# Patient Record
Sex: Female | Born: 1965 | Race: Black or African American | Hispanic: Refuse to answer | Marital: Married | Smoking: Never smoker
Health system: Southern US, Community
[De-identification: ages and names within clinical notes are randomized; demographics above are authoritative.]

## PROBLEM LIST (undated history)

## (undated) DIAGNOSIS — D219 Benign neoplasm of connective and other soft tissue, unspecified: Secondary | ICD-10-CM

## (undated) DIAGNOSIS — D649 Anemia, unspecified: Secondary | ICD-10-CM

## (undated) DIAGNOSIS — IMO0002 Reserved for concepts with insufficient information to code with codable children: Secondary | ICD-10-CM

## (undated) DIAGNOSIS — E785 Hyperlipidemia, unspecified: Secondary | ICD-10-CM

## (undated) DIAGNOSIS — O019 Hydatidiform mole, unspecified: Secondary | ICD-10-CM

## (undated) DIAGNOSIS — R609 Edema, unspecified: Secondary | ICD-10-CM

## (undated) DIAGNOSIS — R600 Localized edema: Secondary | ICD-10-CM

## (undated) HISTORY — DX: Anemia, unspecified: D64.9

## (undated) HISTORY — DX: Benign neoplasm of connective and other soft tissue, unspecified: D21.9

## (undated) HISTORY — DX: Hydatidiform mole, unspecified: O01.9

## (undated) HISTORY — DX: Localized edema: R60.0

## (undated) HISTORY — DX: Hyperlipidemia, unspecified: E78.5

## (undated) HISTORY — DX: Edema, unspecified: R60.9

## (undated) HISTORY — DX: Reserved for concepts with insufficient information to code with codable children: IMO0002

---

## 1986-06-27 DIAGNOSIS — IMO0002 Reserved for concepts with insufficient information to code with codable children: Secondary | ICD-10-CM

## 1986-06-27 DIAGNOSIS — R87619 Unspecified abnormal cytological findings in specimens from cervix uteri: Secondary | ICD-10-CM

## 1986-06-27 HISTORY — DX: Reserved for concepts with insufficient information to code with codable children: IMO0002

## 1986-06-27 HISTORY — DX: Unspecified abnormal cytological findings in specimens from cervix uteri: R87.619

## 1986-06-27 HISTORY — PX: CRYOTHERAPY: SHX1416

## 1995-06-28 DIAGNOSIS — O019 Hydatidiform mole, unspecified: Secondary | ICD-10-CM

## 1995-06-28 HISTORY — DX: Hydatidiform mole, unspecified: O01.9

## 1995-07-27 HISTORY — PX: DILATION AND CURETTAGE OF UTERUS: SHX78

## 1999-06-28 HISTORY — PX: BREAST BIOPSY: SHX20

## 2000-04-17 ENCOUNTER — Encounter: Admission: RE | Admit: 2000-04-17 | Discharge: 2000-04-17 | Payer: Self-pay | Admitting: Obstetrics and Gynecology

## 2000-04-17 ENCOUNTER — Encounter: Payer: Self-pay | Admitting: Obstetrics and Gynecology

## 2000-05-08 ENCOUNTER — Ambulatory Visit (HOSPITAL_COMMUNITY): Admission: RE | Admit: 2000-05-08 | Discharge: 2000-05-08 | Payer: Self-pay

## 2000-05-08 ENCOUNTER — Encounter (INDEPENDENT_AMBULATORY_CARE_PROVIDER_SITE_OTHER): Payer: Self-pay

## 2001-06-05 ENCOUNTER — Other Ambulatory Visit: Admission: RE | Admit: 2001-06-05 | Discharge: 2001-06-05 | Payer: Self-pay | Admitting: Obstetrics and Gynecology

## 2002-06-11 ENCOUNTER — Other Ambulatory Visit: Admission: RE | Admit: 2002-06-11 | Discharge: 2002-06-11 | Payer: Self-pay | Admitting: Obstetrics and Gynecology

## 2003-07-15 ENCOUNTER — Other Ambulatory Visit: Admission: RE | Admit: 2003-07-15 | Discharge: 2003-07-15 | Payer: Self-pay | Admitting: Obstetrics and Gynecology

## 2004-07-21 ENCOUNTER — Other Ambulatory Visit: Admission: RE | Admit: 2004-07-21 | Discharge: 2004-07-21 | Payer: Self-pay | Admitting: Obstetrics and Gynecology

## 2005-03-08 ENCOUNTER — Ambulatory Visit (HOSPITAL_COMMUNITY): Admission: RE | Admit: 2005-03-08 | Discharge: 2005-03-08 | Payer: Self-pay | Admitting: Obstetrics and Gynecology

## 2005-11-09 ENCOUNTER — Other Ambulatory Visit: Admission: RE | Admit: 2005-11-09 | Discharge: 2005-11-09 | Payer: Self-pay | Admitting: Obstetrics and Gynecology

## 2007-07-03 ENCOUNTER — Other Ambulatory Visit: Admission: RE | Admit: 2007-07-03 | Discharge: 2007-07-03 | Payer: Self-pay | Admitting: Obstetrics and Gynecology

## 2007-07-12 ENCOUNTER — Ambulatory Visit (HOSPITAL_COMMUNITY): Admission: RE | Admit: 2007-07-12 | Discharge: 2007-07-12 | Payer: Self-pay | Admitting: Obstetrics and Gynecology

## 2008-02-19 HISTORY — PX: ENDOMETRIAL BIOPSY: SHX622

## 2008-05-27 ENCOUNTER — Encounter: Payer: Self-pay | Admitting: Obstetrics and Gynecology

## 2008-05-27 ENCOUNTER — Ambulatory Visit (HOSPITAL_COMMUNITY): Admission: RE | Admit: 2008-05-27 | Discharge: 2008-05-28 | Payer: Self-pay | Admitting: Obstetrics and Gynecology

## 2008-05-27 HISTORY — PX: LAPAROSCOPIC TOTAL HYSTERECTOMY: SUR800

## 2009-03-23 ENCOUNTER — Ambulatory Visit (HOSPITAL_COMMUNITY): Admission: RE | Admit: 2009-03-23 | Discharge: 2009-03-23 | Payer: Self-pay | Admitting: Obstetrics and Gynecology

## 2010-01-05 ENCOUNTER — Ambulatory Visit: Payer: Self-pay | Admitting: Vascular Surgery

## 2010-03-24 ENCOUNTER — Ambulatory Visit (HOSPITAL_COMMUNITY): Admission: RE | Admit: 2010-03-24 | Discharge: 2010-03-24 | Payer: Self-pay | Admitting: Obstetrics and Gynecology

## 2010-11-09 NOTE — Procedures (Signed)
DUPLEX DEEP VENOUS EXAM - LOWER EXTREMITY   INDICATION:  Chronic bilateral lower extremity edema.   HISTORY:  Edema:  Bilateral.  Trauma/Surgery:  No.  Pain:  No.  PE:  No.  Previous DVT:  No.  Anticoagulants:  No.  Other:   DUPLEX EXAM:                CFV   SFV   PopV  PTV    GSV                R  L  R  L  R  L  R   L  R  L  Thrombosis    o  o  o  o  o  o  o   o  o  o  Spontaneous   +  +  +  +  +  +  +   +  +  +  Phasic        +  +  +  +  +  +  +   +  +  +  Augmentation  +  +  +  +  +  +  +   +  +  +  Compressible  +  +  +  +  +  +  +   +  +  +  Competent     +  +  +  +  +  +  +   +  +  +   Legend:  + - yes  o - no  p - partial  D - decreased   IMPRESSION:  Bilateral lower extremities appear to be negative for deep  venous thrombosis and deep venous reflux.    _____________________________  Quita Skye Hart [place], M.D.   NT/MEDQ  D:  01/05/2010  T:  01/05/2010  Job:  [id number]

## 2010-11-09 NOTE — Op Note (Signed)
NAME:  Katie Bridges, Katie Bridges            ACCOUNT NO.:  [id number]   MEDICAL RECORD NO.:  [id number]          PATIENT TYPE:  OIB   LOCATION:  1524                         FACILITY:  [facility]   PHYSICIAN:  Cynthia P. Romine, M.D.DATE OF BIRTH:  1965/10/01   DATE OF PROCEDURE:  05/27/2008  DATE OF DISCHARGE:                               OPERATIVE REPORT   PREOPERATIVE DIAGNOSIS:  Menorrhagia with large uterine fibroids.   POSTOPERATIVE DIAGNOSIS:  Menorrhagia with large uterine fibroids same  path pending.   PROCEDURE:  Robotically assisted total laparoscopic hysterectomy.   SURGEON:  Cynthia P. Romine, MD   ASSISTANT:  Lum Keas, MD   ANESTHESIA:  General endotracheal.   ESTIMATED BLOOD LOSS:  250 mL.   COMPLICATIONS:  None.   PROCEDURE IN DETAIL:  The patient was taken to the operating room and  after the induction of adequate general endotracheal anesthesia she was  placed in dorsal lithotomy position and prepped and draped in the usual  fashion.  The posterior weighted and anterior Sims retractor were  placed.  The cervix was grasped on its anterior lip with a single-tooth  tenaculum.  The cervix was sounded to 8 cm.  Cervix was dilated to a #23  Pratt and the 8 cm RUMI was inserted using a small KOH ring and a  vaginal occluder.  The balloon was inflated and the RUMI manipulator and  the vaginal occluder was inflated with 60 mL of air.  A Foley catheter  was then placed.  The legs were brought down to a very low position.  Attention was next turned to the abdomen.  A midline incision was made  transversely approximately 8 cm above the umbilicus.  It was infiltrated  with 0.25% Marcaine.  The incision was carried down to the fascia using  hemostats.  The fascia was grasped with Kochers and entered with Mayo  scissors.  In-line peritoneum was elevated with hemostats and entered  atraumatically.  A pursestring suture of zero Vicryl was placed around  the fascia and a Hasson  cannula was then placed in the peritoneal space.  Pneumoperitoneum was created with the automatic insufflator and proper  placement of the cannula was noted with the laparoscope.  Upon  insufflating the abdomen the accessory ports were then placed.  There  were three 8 mm ports and a 10/11 mm assistance port.  The assistance  port was far laterally on the patient's right and the other three ports  were far laterally on the patient's left and then approximately 10 cm  medial on each side from the umbilicus.  The trocars were inserted under  direct visualization.  The robot was then brought in and docked on the  side to allow access to the vagina for anticipated uterine morcellation.  The surgeon then proceeded to the console.  The utero-ovarian ligaments  were cauterized and cut as were the tubes on each side and the  mesosalpinx.  The round ligaments were also cauterized.  The posterior  leaf of the broad ligament was taken down with scissors.  Monopolar  scissors were used as well as the  PK gyrus for cautery.  The anterior  leaf of the broad ligament was taken down on each side as well and the  bladder was dissected off the cervix.  The uterine arteries were  skeletonized and identified, cauterized and cut on each side.  The KOH  ring could be seen bulging through the vagina laparoscopically and the  monopolar scissors were used to incise around the KOH ring and the  specimen was passed from the vagina.  The robot was then undocked and  removed away from the field.  The legs were put in the high dorsal  lithotomy position.  A posterior weighted and anterior Sims retractor  were placed.  The RUMI and the KOH ring were removed.  The cervix was  grasped with the tenaculum and the uterus was morcellated then removed.  The vaginal cuff was then closed vaginally with three interrupted  sutures of zero Vicryl.  The surgeon and the assistance then regowned  and gloved and attention was next  turned abdominally again.  The  laparoscope was inserted.  Pneumoperitoneum was obtained.  The pedicles  were inspected and were all free of bleeding.  The vaginal cuff was  inspected and irrigated and likewise was free of bleeding.  The trocar  sleeves were removed under direct visualization.  The pneumoperitoneum  was allowed to escape.  Hasson was removed.  The fascia was closed with  the pursestring suture that had been previously placed.  A single suture  of zero Vicryl was placed in the fascia at the assistance port to close  the fascia and that incision and the midline incision was closed  subcuticularly with 4-0 Vicryl Rapide.  The other three 8 mm trocar  sites were closed with Dermabond.  It should be noted that there was a  small nick in the vaginal mucosa superior laterally on the patient's  right presumably from the knife as the large bulky specimen was being  morcellated.  It was closed with a running suture of 4-0 Vicryl and  hemostasis was achieved.  The patient was then allowed to return to the  supine position.  The urine was clear and copious.  Estimated blood loss  was 250 mL.  Sponge and instrument counts were correct.  The patient was  taken to the recovery room in satisfactory condition.      Cynthia P. Romine, M.D.  Electronically Signed     CPR/MEDQ  D:  05/27/2008  T:  05/28/2008  Job:  [id number]

## 2010-11-09 NOTE — Consult Note (Signed)
NEW PATIENT CONSULTATION   Katie Bridges, Katie Bridges  DOB:  [birth year]                                       01/05/2010  EAVWU#:[id number]   Ms. Norbeck is a 45 year old healthy female who is been having edema in  both legs for the past 15 years, right worse than left.  She has no  history of DVT, thrombophlebitis, lymphedema, stasis ulcers, bleeding or  other complications.  She has been evaluated the past for cardiac and  renal problems and the evaluation has been negative.  She has now been  rereferred to evaluate her lower extremity edema.  She does not wear  elastic compression stockings nor elevate the legs at night.   Chronic medical problems:  She has a history of a molar pregnancy in  1996, required chemotherapy, is doing well now.  Previous surgery  includes a hysterectomy.  She has no history of diabetes, hypertension,  coronary artery disease, COPD or stroke.   FAMILY HISTORY:  Negative for coronary artery disease, diabetes and  stroke.   SOCIAL HISTORY:  She is married.  She is a housewife.  She does not use  tobacco or alcohol.   REVIEW OF SYSTEMS:  Totally negative.  No weight loss, anorexia, chest  pain, dyspnea on exertion, PND, or orthopnea.  No asthma.  No wheezing.  No reflux esophagitis.  Denies any GU or vascular symptoms.  Able to  ambulate at least 1 to 2 miles.  No arthritis.  All systems are  negative.   PHYSICAL EXAMINATION:  Blood pressure 115/67, heart rate 79, temperature  97.8.  GENERAL:  This is a well-developed, well-nourished female in no apparent  distress, alert and oriented x3.  HEENT:  Exam normal.  EOMs intact.  LUNGS:  Clear to auscultation.  No rhonchi or wheezing.  CARDIOVASCULAR:  Regular rhythm.  No murmurs.  Carotid pulses 3+.  No  audible bruits.  ABDOMEN:  Soft, nontender with no masses.  MUSCULOSKELETAL:  Exam is free of major deformities.  NEUROLOGIC:  Normal.  SKIN:  Free of rashes.  Lower extremity exam  reveals 3+ femoral, popliteal and 2+ dorsalis pedis  pulses bilaterally.  The right calf has 1+ edema.  The left calf has 1+  edema.  The dorsum of the right foot is more swollen than the left  having 1 to 2+ edema.  Both feet are well-perfused.  There is no  evidence of any varicosities, hyperpigmentation or ulceration.   Today I ordered a venous duplex exam which I reviewed and interpreted.  There is no evidence of any deep venous obstruction.  No evidence of any  deep venous reflux or saphenous reflux in either lower extremity.   I reassured her regarding these findings and made the following  recommendations:  1. Elevate the legs 3-4 inches at night by elevating the foot of the      bed of the mattress.  2. Apply short-leg elastic compression stockings first thing in the      morning to be worn during the daytime.  We have fitted her for      those stockings today and hopefully that will help her symptoms.      No further recommendations.     Quita Skye Hart [place], M.D.  Electronically Signed   JDL/MEDQ  D:  01/05/2010  T:  01/06/2010  Job:  [id number]   cc:   Dr. Eulis Foster P. Romine, M.D.  Brooke Bonito

## 2010-11-12 NOTE — Op Note (Signed)
[facility]  Patient:    Katie Bridges, Katie Bridges                   MRN: [id number] Proc. Date: 05/08/00 Adm. Date:  [id number] Attending:  Meredith Leeds                           Operative Report  PREOPERATIVE DIAGNOSES:  Left breast mass.  POSTOPERATIVE DIAGNOSES:  Left breast mass.  OPERATION:  Left breast biopsy.  SURGEON:  Zigmund Daniel, M.D.  ANESTHESIA:  Local with sedation.  DESCRIPTION OF PROCEDURE:  After the patient was adequately monitored and sedated and had the left breast prepped and draped, I liberally infused local anesthetic in the area just superior to the areolar.  She was comfortable throughout the procedure.  I made a short circular areolar incision and dissected through the fat up to a palpable mass just medial and superior to the areolar.  I completely excised the mass using cautery and got hemostasis with the cautery.  I felt no other masses.  It felt like fibroadenoma. Sponge, needle and instrument counts were correct.  I closed the skin with intracuticular 4-0 Vicryl and Steri-Strips and a bladder compressive bandage. She tolerated the operation well. DD:  05/08/00 TD:  05/08/00 Job: [id number]

## 2011-03-29 LAB — CBC
HCT: 31.1 % — ABNORMAL LOW (ref 36.0–46.0)
Hemoglobin: 10.2 g/dL — ABNORMAL LOW (ref 12.0–15.0)
MCV: 82.7 fL (ref 78.0–100.0)
RBC: 3.76 MIL/uL — ABNORMAL LOW (ref 3.87–5.11)
WBC: 4 10*3/uL (ref 4.0–10.5)

## 2011-03-29 LAB — HCG, SERUM, QUALITATIVE: Preg, Serum: NEGATIVE

## 2011-04-01 LAB — CBC
Hemoglobin: 8.5 g/dL — ABNORMAL LOW (ref 12.0–15.0)
RBC: 3.07 MIL/uL — ABNORMAL LOW (ref 3.87–5.11)
RDW: 13.8 % (ref 11.5–15.5)
WBC: 8.5 10*3/uL (ref 4.0–10.5)

## 2011-04-01 LAB — TYPE AND SCREEN

## 2011-04-01 LAB — ABO/RH: ABO/RH(D): O POS

## 2011-04-11 ENCOUNTER — Other Ambulatory Visit: Payer: Self-pay | Admitting: Obstetrics and Gynecology

## 2011-04-11 DIAGNOSIS — Z1231 Encounter for screening mammogram for malignant neoplasm of breast: Secondary | ICD-10-CM

## 2011-04-20 ENCOUNTER — Ambulatory Visit (HOSPITAL_COMMUNITY)
Admission: RE | Admit: 2011-04-20 | Discharge: 2011-04-20 | Disposition: A | Discharge status: Home / Self Care | Payer: 59 | Source: Ambulatory Visit | Attending: Obstetrics and Gynecology | Admitting: Obstetrics and Gynecology

## 2011-04-20 DIAGNOSIS — Z1231 Encounter for screening mammogram for malignant neoplasm of breast: Secondary | ICD-10-CM

## 2011-05-04 ENCOUNTER — Ambulatory Visit (HOSPITAL_COMMUNITY): Payer: Self-pay

## 2013-02-14 ENCOUNTER — Encounter: Payer: Self-pay | Admitting: Certified Nurse Midwife

## 2013-02-15 ENCOUNTER — Encounter: Payer: Self-pay | Admitting: Obstetrics and Gynecology

## 2013-02-15 ENCOUNTER — Ambulatory Visit (INDEPENDENT_AMBULATORY_CARE_PROVIDER_SITE_OTHER): Payer: 59 | Admitting: Obstetrics and Gynecology

## 2013-02-15 VITALS — BP 140/67 | HR 84 | Resp 20 | Ht 64.0 in | Wt 143.0 lb

## 2013-02-15 DIAGNOSIS — Z0000 Encounter for general adult medical examination without abnormal findings: Secondary | ICD-10-CM

## 2013-02-15 DIAGNOSIS — Z01419 Encounter for gynecological examination (general) (routine) without abnormal findings: Secondary | ICD-10-CM

## 2013-02-15 LAB — POCT URINALYSIS DIPSTICK
Leukocytes, UA: NEGATIVE
Protein, UA: NEGATIVE
Urobilinogen, UA: NEGATIVE

## 2013-02-15 LAB — HEMOGLOBIN, FINGERSTICK: Hemoglobin, fingerstick: 12.3 g/dL (ref 12.0–16.0)

## 2013-02-15 NOTE — Progress Notes (Signed)
47 y.o.   Married    [place] American   female   G2P0020   here for annual exam.    No LMP recorded. Patient has had a hysterectomy.          Sexually active: yes  The current method of family planning is status post hysterectomy.    Exercising: walking 3 times a week Last mammogram:04/20/11 neg   Last pap smear:07/03/07 neg History of abnormal pap: no Smoking:never Alcohol:no Last colonoscopy:never Last Bone Density:  never Last tetanus shot: less than 10 years Last cholesterol check: 2011 total 231, LDL 157, HDL 52  Hgb:  12.3              Urine: neg   No family history on file.  There are no active problems to display for this patient.   Past Medical History  Diagnosis Date  . 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 Abnormal Pap smear 1988  . Fibroid   . Chronic edema   . Anemia     Past Surgical History  Procedure Laterality Date  . Laparoscopic total hysterectomy  05-27-08    robotic (fibroids)  . Cryotherapy  1988  . Breast biopsy  2001    benign  . Dilation and curettage of uterus  07/27/95    molar pregnancy    Allergies: Macrobid  Current Outpatient Prescriptions  Medication Sig Dispense Refill  . Multiple Vitamins-Iron (MULTIVITAMIN/IRON PO) Take by mouth daily.       No current facility-administered medications for this visit.    ROS: Pertinent items are noted in HPI.  Social Hx: Married, no children,    Exam:    BP 140/67  Pulse 84  Resp 20  Ht 5\' 4"  (1.626 m)  Wt 143 lb (64.864 kg)  BMI 24.53 kg/m2 Ht and wt stable from last year  Wt Readings from Last 3 Encounters:  02/15/13 143 lb (64.864 kg)     Ht Readings from Last 3 Encounters:  02/15/13 5\' 4"  (1.626 m)    General appearance: alert, cooperative and appears stated age Head: Normocephalic, without obvious abnormality, atraumatic Neck: no adenopathy, supple, symmetrical, trachea midline and thyroid not enlarged, symmetric, no  tenderness/mass/nodules Lungs: clear to auscultation bilaterally Breasts: Inspection negative, No nipple retraction or dimpling, No nipple discharge or bleeding, No axillary or supraclavicular adenopathy, Normal to palpation without dominant masses Heart: regular rate and rhythm Abdomen: soft, non-tender; bowel sounds normal; no masses,  no organomegaly Extremities: extremities normal, atraumatic, no cyanosis or edema Skin: Skin color, texture, turgor normal. No rashes or lesions Lymph nodes: Cervical, supraclavicular, and axillary nodes normal. No abnormal inguinal nodes palpated Neurologic: Grossly normal   Pelvic: External genitalia:  no lesions              Urethra:  normal appearing urethra with no masses, tenderness or lesions              Bartholins and Skenes: normal                 Vagina: normal appearing vagina with normal color and discharge, no lesions              Cervix: absent              Pap taken: no        Bimanual Exam:  Uterus:  absent  Adnexa: normal adnexa in size, nontender and no masses                                      Rectovaginal: Confirms                                      Anus:  normal sphincter tone, no lesions  A: normal gyn exam     S/p R-TLH, fibroids 324 g in 2009     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

## 2013-02-15 NOTE — Patient Instructions (Addendum)

## 2014-01-15 ENCOUNTER — Telehealth: Payer: Self-pay | Admitting: Obstetrics and Gynecology

## 2014-01-15 NOTE — Telephone Encounter (Signed)
Spoke with patient. Patient states that she noticed a "bump" on the left side inside the "lip" of her vagina on July 4th. Patient put neosporin on bump and it went away. Patient woke up this morning and has a new bump on the right side. Patient states that the bumps have been painful to the touch and are "the same color as my skin." Patient requesting appointment to be seen. Requesting early morning appointment. Offered 10am appointment tomorrow but patient declines.Appointment scheduled for Friday 7/24 at 10am with Regina Eck CNM. Patient agreeable to date and time.  Routing to provider for final review. Patient agreeable to disposition. Will close encounter

## 2014-01-15 NOTE — Telephone Encounter (Signed)
Patient said she has a pimple like spot on the right side of her vagina said she had one on the left side on July 4th

## 2014-01-17 ENCOUNTER — Encounter: Payer: Self-pay | Admitting: Certified Nurse Midwife

## 2014-01-17 ENCOUNTER — Ambulatory Visit (INDEPENDENT_AMBULATORY_CARE_PROVIDER_SITE_OTHER): Payer: 59 | Admitting: Certified Nurse Midwife

## 2014-01-17 VITALS — BP 100/60 | HR 64 | Resp 16 | Ht 64.0 in | Wt 149.0 lb

## 2014-01-17 DIAGNOSIS — L723 Sebaceous cyst: Secondary | ICD-10-CM

## 2014-01-17 DIAGNOSIS — B373 Candidiasis of vulva and vagina: Secondary | ICD-10-CM

## 2014-01-17 DIAGNOSIS — B3731 Acute candidiasis of vulva and vagina: Secondary | ICD-10-CM

## 2014-01-17 MED ORDER — TERCONAZOLE 0.4 % VA CREA: 1.0000 | TOPICAL_CREAM | Freq: Every day | VAGINAL | Status: DC

## 2014-01-17 NOTE — Patient Instructions (Signed)

## 2014-01-17 NOTE — Progress Notes (Signed)
48 y.o. married african american female g2p0020 here with complaint of vaginal symptoms of itching, burning after sexual activity and increase discharge. Describes discharge as white. Patient also complaining of noted bump on labia 12/28/13 and again 01/16/14 which she applied Neopsorin to and now feel has resolved. No new personal products, some vaginal dryness but uses lubrication. Onset of symptoms 3 days ago, no STD concerns. Urinary symptoms none .   O:Healthy female WDWN Affect: normal, orientation x 3  Exam: Abdomen:soft Lymph node: no enlargement or tenderness Pelvic exam: External genital: tiny sebaceous cyst noted on posterior aspect of right labia, noted on left labia BUS: negative Vagina: white scant discharge, slight tenderness noted. Ph: 4.5  ,Wet prep taken Cervix:absent Uterus: absent Adnexa:normal, non tender, no masses or fullness noted   Wet Prep results:Positive for yeast   A:Sebaceous cyst Yeast vaginitis   P:Discussed findings of sebaceous cyst and etiology.Questions addressed. Discussed yeast vaginitis and need for treatment. Discussed Aveeno or baking soda sitz bath for comfort. Avoid moist clothes or pads for extended period of time. If working out in gym clothes or swim suits for long periods of time change underwear or bottoms of swimsuit if possible. Olive Oil use for skin protection prior to activity can be used to external skin. Rx: Terazol 7 vaginal cream Try coconut oil for vaginal dryness prn  Rv prn

## 2014-01-24 NOTE — Progress Notes (Signed)
Note reviewed, agree with plan.  Jette Lewan, MD  

## 2014-02-20 ENCOUNTER — Ambulatory Visit (INDEPENDENT_AMBULATORY_CARE_PROVIDER_SITE_OTHER): Payer: 59 | Admitting: Obstetrics and Gynecology

## 2014-02-20 ENCOUNTER — Encounter: Payer: Self-pay | Admitting: Obstetrics and Gynecology

## 2014-02-20 VITALS — BP 124/82 | HR 72 | Ht 65.0 in | Wt 151.0 lb

## 2014-02-20 DIAGNOSIS — Z01419 Encounter for gynecological examination (general) (routine) without abnormal findings: Secondary | ICD-10-CM

## 2014-02-20 DIAGNOSIS — Z0000 Encounter for general adult medical examination without abnormal findings: Secondary | ICD-10-CM

## 2014-02-20 DIAGNOSIS — N951 Menopausal and female climacteric states: Secondary | ICD-10-CM

## 2014-02-20 LAB — POCT URINALYSIS DIPSTICK
Bilirubin, UA: NEGATIVE
GLUCOSE UA: NEGATIVE
Ketones, UA: NEGATIVE
Leukocytes, UA: NEGATIVE
Nitrite, UA: NEGATIVE
PH UA: 6
Protein, UA: NEGATIVE
RBC UA: NEGATIVE
UROBILINOGEN UA: NEGATIVE

## 2014-02-20 NOTE — Progress Notes (Signed)
GYNECOLOGY VISIT  PCP: Dr. Daryel Gerald  Referring provider:   HPI: 48 y.o.   Married  [place] American  female   [phone] with Patient's last menstrual period was 05/27/2008.   here for   annual Left labial pain like a throbbing, lasting a second.  Uncertain if related to her new underwear.  Had a sebaceous cyst and yeast vaginitis recently.   Some hot flashes but they are manageable.  Feels like she is sweating more.  No vaginal dryness.   Hgb:  PCP Urine:  Neg ph: 6.0  GYNECOLOGIC HISTORY: Patient's last menstrual period was 05/27/2008. Sexually active:  yes Partner preference: female Contraception:   hysterectomy Menopausal hormone therapy: no DES exposure:   no Blood transfusions: yes, 1997 from the Gestational trophoblastic disease   Sexually transmitted diseases:no    GYN procedures and prior surgeries: Hysterectomy, D&C, robotic TLH for fibroids in 2009  Last mammogram:   07/2013 per pt thur pcp.  Due for a recheck in October 2015.  Would like to return to a facility in [place].              Last pap and high risk HPV testing: 07/03/07 neg   History of abnormal pap smear:  no   OB History   Grav Para Term Preterm Abortions TAB SAB Ect Mult Living   2 0 0  2  2   0       LIFESTYLE: Exercise:  Walking 2 times a week              OTHER HEALTH MAINTENANCE: Tetanus/TDap:2002.  Patient will check with PCP.  HPV: no Influenza:  02/15   Bone density:never Colonoscopy:never  Cholesterol check: PCP  History reviewed. No pertinent family history.  There are no active problems to display for this patient.  Past Medical History  Diagnosis Date  . 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 Abnormal Pap smear 1988  . Fibroid   . Chronic edema   . Anemia     Past Surgical History  Procedure Laterality Date  . Laparoscopic total hysterectomy  05-27-08    robotic (fibroids)  . Cryotherapy  1988  . Breast biopsy  2001    benign   . Dilation and curettage of uterus  07/27/95    molar pregnancy  . Endometrial biopsy  02/19/08    normal    ALLERGIES: Latex and Macrobid  Current Outpatient Prescriptions  Medication Sig Dispense Refill  . Multiple Vitamins-Iron (MULTIVITAMIN/IRON PO) Take by mouth daily.      Marland Kitchen terconazole (TERAZOL 7) 0.4 % vaginal cream Place 1 applicator vaginally at bedtime.  45 g  0   No current facility-administered medications for this visit.     ROS:  Pertinent items are noted in HPI.  History   Social History  . Marital Status: Married    Spouse Name: N/A    Number of Children: N/A  . Years of Education: N/A   Occupational History  . Not on file.   Social History Main Topics  . Smoking status: Never Smoker   . Smokeless tobacco: Never Used  . Alcohol Use: No  . Drug Use: No  . Sexual Activity: Yes    Partners: Male    Birth Control/ Protection: Surgical     Comment: R-TLH 2009   Other Topics Concern  . Not on file   Social History Narrative  . No narrative on file  PHYSICAL EXAMINATION:    BP 124/82  Pulse 72  Ht 5\' 5"  (1.651 m)  Wt 151 lb (68.493 kg)  BMI 25.13 kg/m2  LMP 05/27/2008   Wt Readings from Last 3 Encounters:  02/20/14 151 lb (68.493 kg)  01/17/14 149 lb (67.586 kg)  02/15/13 143 lb (64.864 kg)     Ht Readings from Last 3 Encounters:  02/20/14 5\' 5"  (1.651 m)  01/17/14 5\' 4"  (1.626 m)  02/15/13 5\' 4"  (1.626 m)    General appearance: alert, cooperative and appears stated age Head: Normocephalic, without obvious abnormality, atraumatic Neck: no adenopathy, supple, symmetrical, trachea midline and thyroid not enlarged, symmetric, no tenderness/mass/nodules Lungs: clear to auscultation bilaterally Breasts: Inspection negative, No nipple retraction or dimpling, No nipple discharge or bleeding, No axillary or supraclavicular adenopathy, Normal to palpation without dominant masses Heart: regular rate and rhythm Abdomen: soft, non-tender; no  masses,  no organomegaly Extremities: extremities normal, atraumatic, no cyanosis or edema Skin: Skin color, texture, turgor normal. No rashes or lesions Lymph nodes: Cervical, supraclavicular, and axillary nodes normal. No abnormal inguinal nodes palpated Neurologic: Grossly normal  Pelvic: External genitalia:  no lesions              Urethra:  normal appearing urethra with no masses, tenderness or lesions              Bartholins and Skenes: normal                 Vagina: normal appearing vagina with normal color and discharge, no lesions              Cervix:  absent              Pap and high risk HPV testing done: No.        Bimanual Exam:  Uterus:   absent                                      Adnexa: normal adnexa in size, nontender and no masses                                      Rectovaginal:  Yes.                                        Confirms above.                                      Anus:  normal sphincter tone, no lesions  ASSESSMENT  Normal gynecologic exam. Status post robotic TLH. Menopausal symptoms.  PLAN  Mammogram recommended yearly starting at age 30. Pap smear and high risk HPV testing as above. Counseled on self breast exam, Calcium and vitamin D intake, exercise. See lab orders: No. Discussed treatment of menopausal symptoms - herbal options, estrogen replacement, vaginal lubricants - estrogen and nonestrogen, SSRIs. Will do herbal options now.  Brochure to patient about menopause.  Return annually or prn   An After Visit Summary was printed and given to the patient.

## 2014-02-20 NOTE — Patient Instructions (Signed)
EXERCISE AND DIET:  We recommended that you start or continue a regular exercise program for good health. Regular exercise means any activity that makes your heart beat faster and makes you sweat.  We recommend exercising at least 30 minutes per day at least 3 days a week, preferably 4 or 5.  We also recommend a diet low in fat and sugar.  Inactivity, poor dietary choices and obesity can cause diabetes, heart attack, stroke, and kidney damage, among others.    ALCOHOL AND SMOKING:  Women should limit their alcohol intake to no more than 7 drinks/beers/glasses of wine (combined, not each!) per week. Moderation of alcohol intake to this level decreases your risk of breast cancer and liver damage. And of course, no recreational drugs are part of a healthy lifestyle.  And absolutely no smoking or even second hand smoke. Most people know smoking can cause heart and lung diseases, but did you know it also contributes to weakening of your bones? Aging of your skin?  Yellowing of your teeth and nails?  CALCIUM AND VITAMIN D:  Adequate intake of calcium and Vitamin D are recommended.  The recommendations for exact amounts of these supplements seem to change often, but generally speaking 600 mg of calcium (either carbonate or citrate) and 800 units of Vitamin D per day seems prudent. Certain women may benefit from higher intake of Vitamin D.  If you are among these women, your doctor will have told you during your visit.    PAP SMEARS:  Pap smears, to check for cervical cancer or precancers,  have traditionally been done yearly, although recent scientific advances have shown that most women can have pap smears less often.  However, every woman still should have a physical exam from her gynecologist every year. It will include a breast check, inspection of the vulva and vagina to check for abnormal growths or skin changes, a visual exam of the cervix, and then an exam to evaluate the size and shape of the uterus and  ovaries.  And after 49 years of age, a rectal exam is indicated to check for rectal cancers. We will also provide age appropriate advice regarding health maintenance, like when you should have certain vaccines, screening for sexually transmitted diseases, bone density testing, colonoscopy, mammograms, etc.   MAMMOGRAMS:  All women over 40 years old should have a yearly mammogram. Many facilities now offer a "3D" mammogram, which may cost around $50 extra out of pocket. If possible,  we recommend you accept the option to have the 3D mammogram performed.  It both reduces the number of women who will be called back for extra views which then turn out to be normal, and it is better than the routine mammogram at detecting truly abnormal areas.    COLONOSCOPY:  Colonoscopy to screen for colon cancer is recommended for all women at age 50.  We know, you hate the idea of the prep.  We agree, BUT, having colon cancer and not knowing it is worse!!  Colon cancer so often starts as a polyp that can be seen and removed at colonscopy, which can quite literally save your life!  And if your first colonoscopy is normal and you have no family history of colon cancer, most women don't have to have it again for 10 years.  Once every ten years, you can do something that may end up saving your life, right?  We will be happy to help you get it scheduled when you are ready.    Be sure to check your insurance coverage so you understand how much it will cost.  It may be covered as a preventative service at no cost, but you should check your particular policy.     Menopause and Herbal Products Menopause is the normal time of life when menstrual periods stop completely. Menopause is complete when you have missed 12 consecutive menstrual periods. It usually occurs between the ages of 33 to 51, with an average age of 9. Very rarely does a woman develop menopause before 49 years old. At menopause, your ovaries stop producing the female  hormones, estrogen and progesterone. This can cause undesirable symptoms and also affect your health. Sometimes the symptoms can occur 4 to 5 years before the menopause begins. There is no relationship between menopause and:  Oral contraceptives.  Number of children you had.  Race.  The age your menstrual periods started (menarche). Heavy smokers and very thin women may develop menopause earlier in life. Estrogen and progesterone hormone treatment is the usual method of treating menopausal symptoms. However, there are women who should not take hormone treatment. This is true of:   Women that have breast or uterine cancer.  Women who prefer not to take hormones because of certain side effects (abnormal uterine bleeding).  Women who are afraid that hormones may cause breast cancer.  Women who have a history of liver disease, heart disease, stroke, or blood clots. For these women, there are other medications that may help treat their menopausal symptoms. These medications are found in plants and botanical products. They can be found in the form of herbs, teas, oils, tinctures, and pills.  CAUSES:  The ovaries stop producing the female hormones estrogen and progesterone.  Other causes include:  Surgery to remove both ovaries.  The ovaries stop functioning for no know reason.  Tumors of the pituitary gland in the brain.  Medical disease that affects the ovaries and hormone production.  Radiation treatment to the abdomen or pelvis.  Chemotherapy that affects the ovaries. PHYTOESTROGENS: Phytoestrogens occur naturally in plants and plant products. They act like estrogen in the body. Herbal medications are made from these plants and botanical steroids. There are 3 types of phytoestrogens:  Isoflavones (genistein and daidzein) are found in soy, garbanzo beans, miso and tofu foods.  Ligins are found in the shell of seeds. They are used to make oils like flaxseed oil. The bacteria in  your intestine act on these foods to produce the estrogen-like hormones.  Coumestans are estrogen-like. Some of the foods they are found in include sunflower seeds and bean sprouts. CONDITIONS AND THEIR POSSIBLE HERBAL TREATMENT:  Hot flashes and night sweats.  Soy, black cohosh and evening primrose.  Irritability, insomnia, depression and memory problems.  Chasteberry, ginseng, and soy.  St. John's wort may be helpful for depression. However, there is a concern of it causing cataracts of the eye and may have bad effects on other medications. St. John's wort should not be taken for long time and without your caregiver's advice.  Loss of libido and vaginal and skin dryness.  Wild yam and soy.  Prevention of coronary heart disease and osteoporosis.  Soy and Isoflavones. Several studies have shown that some women benefit from herbal medications, but most of the studies have not consistently shown that these supplements are much better than placebo. Other forms of treatment to help women with menopausal symptoms include a balanced diet, rest, exercise, vitamin and calcium (with vitamin D) supplements, acupuncture, and group therapy  when necessary. THOSE WHO SHOULD NOT TAKE HERBAL MEDICATIONS INCLUDE:  Women who are planning on getting pregnant unless told by your caregiver.  Women who are breastfeeding unless told by your caregiver.  Women who are taking other prescription medications unless told by your caregiver.  Infants, children, and elderly women unless told by your caregiver. Different herbal medications have different and unmeasured amounts of the herbal ingredients. There are no regulations, quality control, and standardization of the ingredients in herbal medications. Therefore, the amount of the ingredient in the medication may vary from one herb, pill, tea, oil or tincture to another. Many herbal medications can cause serious problems and can even have poisonous effects if  taken too much or too long. If problems develop, the medication should be stopped and recorded by your caregiver. HOME CARE INSTRUCTIONS  Do not take or give children herbal medications without your caregiver's advice.  Let your caregiver know all the medications you are taking. This includes prescription, over-the-counter, eye drops, and creams.  Do not take herbal medications longer or more than recommended.  Tell your caregiver about any side effects from the medication. SEEK MEDICAL CARE IF:  You develop a fever of 102 F (38.9 C), or as directed by your caregiver.  You feel sick to your stomach (nauseous), vomit, or have diarrhea.  You develop a rash.  You develop abdominal pain.  You develop severe headaches.  You start to have vision problems.  You feel dizzy or faint.  You start to feel numbness in any part of your body.  You start shaking (have convulsions). Document Released: 11/30/2007 Document Revised: 05/30/2012 Document Reviewed: 06/29/2010 [employer] Patient Information 2015 [employer]. This information is not intended to replace advice given to you by your health care provider. Make sure you discuss any questions you have with your health care provider.   

## 2014-02-28 ENCOUNTER — Telehealth: Payer: Self-pay | Admitting: Obstetrics and Gynecology

## 2014-02-28 NOTE — Telephone Encounter (Signed)
Pt says she received a bill for [id number]. But i do not see a balance could you take a look and call pt back

## 2014-03-07 ENCOUNTER — Telehealth: Payer: Self-pay | Admitting: Emergency Medicine

## 2014-03-07 DIAGNOSIS — N6310 Unspecified lump in the right breast, unspecified quadrant: Secondary | ICD-10-CM

## 2014-03-07 NOTE — Telephone Encounter (Signed)
Calling patient to discuss 6 month follow up R Breast Mammogram with patient. Prior patient of [facility], imaging done 10/24/13 and needed 6 month follow up R breast Dx mammogram. Per Dr. Quincy Simmonds, patient may have wanted to change care to [facility] for imaging.   Will discuss at return call. If would like to have [facility] provider, to order new imaging at [facility] per Dr. Quincy Simmonds.  Will await return call from patient to discuss.

## 2014-03-07 NOTE — Telephone Encounter (Signed)
Spoke with patient. She would like to have imaging scheduled in [place]. Advised can schedule at [facility]. Patient agreeable.  R Breast diagnostic imaging scheduled for 04/28/14 at 1100. Patient advised she will need to request her images be sent from [place]. She will need to request records directly so [facility] can [facility].  Patient agreeable.  Recall entered for 03/2014.  Routing to provider for final review. Patient agreeable to disposition. Will close encounter

## 2014-04-28 ENCOUNTER — Ambulatory Visit
Admission: RE | Admit: 2014-04-28 | Discharge: 2014-04-28 | Disposition: A | Discharge status: Home / Self Care | Payer: 59 | Source: Ambulatory Visit | Attending: Obstetrics and Gynecology | Admitting: Obstetrics and Gynecology

## 2014-04-28 ENCOUNTER — Encounter: Payer: Self-pay | Admitting: Obstetrics and Gynecology

## 2014-04-28 DIAGNOSIS — N6310 Unspecified lump in the right breast, unspecified quadrant: Secondary | ICD-10-CM

## 2014-06-27 DIAGNOSIS — E785 Hyperlipidemia, unspecified: Secondary | ICD-10-CM

## 2014-06-27 HISTORY — DX: Hyperlipidemia, unspecified: E78.5

## 2014-10-07 ENCOUNTER — Other Ambulatory Visit: Payer: Self-pay | Admitting: Obstetrics and Gynecology

## 2014-10-07 DIAGNOSIS — N6489 Other specified disorders of breast: Secondary | ICD-10-CM

## 2014-10-28 ENCOUNTER — Ambulatory Visit
Admission: RE | Admit: 2014-10-28 | Discharge: 2014-10-28 | Disposition: A | Discharge status: Home / Self Care | Payer: 59 | Source: Ambulatory Visit | Attending: Obstetrics and Gynecology | Admitting: Obstetrics and Gynecology

## 2014-10-28 DIAGNOSIS — N6489 Other specified disorders of breast: Secondary | ICD-10-CM

## 2015-03-11 ENCOUNTER — Encounter: Payer: Self-pay | Admitting: Obstetrics and Gynecology

## 2015-03-11 ENCOUNTER — Ambulatory Visit (INDEPENDENT_AMBULATORY_CARE_PROVIDER_SITE_OTHER): Payer: 59 | Admitting: Obstetrics and Gynecology

## 2015-03-11 VITALS — BP 100/68 | HR 76 | Resp 20 | Ht 64.5 in | Wt 149.6 lb

## 2015-03-11 DIAGNOSIS — N760 Acute vaginitis: Secondary | ICD-10-CM

## 2015-03-11 DIAGNOSIS — N951 Menopausal and female climacteric states: Secondary | ICD-10-CM

## 2015-03-11 DIAGNOSIS — Z01419 Encounter for gynecological examination (general) (routine) without abnormal findings: Secondary | ICD-10-CM

## 2015-03-11 DIAGNOSIS — Z0000 Encounter for general adult medical examination without abnormal findings: Secondary | ICD-10-CM | POA: Diagnosis not present

## 2015-03-11 LAB — POCT URINALYSIS DIPSTICK
BILIRUBIN UA: NEGATIVE
Blood, UA: NEGATIVE
Glucose, UA: NEGATIVE
KETONES UA: NEGATIVE
LEUKOCYTES UA: NEGATIVE
NITRITE UA: NEGATIVE
PH UA: 5
Protein, UA: NEGATIVE
Urobilinogen, UA: NEGATIVE

## 2015-03-11 MED ORDER — ESTRADIOL 1 MG PO TABS: 1.0000 mg | ORAL_TABLET | Freq: Every day | ORAL | Status: DC

## 2015-03-11 MED ORDER — ESTRADIOL 10 MCG VA TABS: ORAL_TABLET | VAGINAL | Status: DC

## 2015-03-11 NOTE — Patient Instructions (Addendum)
EXERCISE AND DIET:  We recommended that you start or continue a regular exercise program for good health. Regular exercise means any activity that makes your heart beat faster and makes you sweat.  We recommend exercising at least 30 minutes per day at least 3 days a week, preferably 4 or 5.  We also recommend a diet low in fat and sugar.  Inactivity, poor dietary choices and obesity can cause diabetes, heart attack, stroke, and kidney damage, among others.    ALCOHOL AND SMOKING:  Women should limit their alcohol intake to no more than 7 drinks/beers/glasses of wine (combined, not each!) per week. Moderation of alcohol intake to this level decreases your risk of breast cancer and liver damage. And of course, no recreational drugs are part of a healthy lifestyle.  And absolutely no smoking or even second hand smoke. Most people know smoking can cause heart and lung diseases, but did you know it also contributes to weakening of your bones? Aging of your skin?  Yellowing of your teeth and nails?  CALCIUM AND VITAMIN D:  Adequate intake of calcium and Vitamin D are recommended.  The recommendations for exact amounts of these supplements seem to change often, but generally speaking 600 mg of calcium (either carbonate or citrate) and 800 units of Vitamin D per day seems prudent. Certain women may benefit from higher intake of Vitamin D.  If you are among these women, your doctor will have told you during your visit.    PAP SMEARS:  Pap smears, to check for cervical cancer or precancers,  have traditionally been done yearly, although recent scientific advances have shown that most women can have pap smears less often.  However, every woman still should have a physical exam from her gynecologist every year. It will include a breast check, inspection of the vulva and vagina to check for abnormal growths or skin changes, a visual exam of the cervix, and then an exam to evaluate the size and shape of the uterus and  ovaries.  And after 50 years of age, a rectal exam is indicated to check for rectal cancers. We will also provide age appropriate advice regarding health maintenance, like when you should have certain vaccines, screening for sexually transmitted diseases, bone density testing, colonoscopy, mammograms, etc.   MAMMOGRAMS:  All women over 40 years old should have a yearly mammogram. Many facilities now offer a "3D" mammogram, which may cost around $50 extra out of pocket. If possible,  we recommend you accept the option to have the 3D mammogram performed.  It both reduces the number of women who will be called back for extra views which then turn out to be normal, and it is better than the routine mammogram at detecting truly abnormal areas.    COLONOSCOPY:  Colonoscopy to screen for colon cancer is recommended for all women at age 50.  We know, you hate the idea of the prep.  We agree, BUT, having colon cancer and not knowing it is worse!!  Colon cancer so often starts as a polyp that can be seen and removed at colonscopy, which can quite literally save your life!  And if your first colonoscopy is normal and you have no family history of colon cancer, most women don't have to have it again for 10 years.  Once every ten years, you can do something that may end up saving your life, right?  We will be happy to help you get it scheduled when you are ready.    Be sure to check your insurance coverage so you understand how much it will cost.  It may be covered as a preventative service at no cost, but you should check your particular policy.     Estradiol tablets What is this medicine? ESTRADIOL (es tra DYE ole) is an estrogen. It is mostly used as hormone replacement in menopausal women. It helps to treat hot flashes and prevent osteoporosis. It is also used to treat women with low estrogen levels or those who have had their ovaries removed. This medicine may be used for other purposes; ask your health care  provider or pharmacist if you have questions. COMMON BRAND NAME(S): Estrace, Femtrace, Gynodiol What should I tell my health care provider before I take this medicine? They need to know if you have or ever had any of these conditions: -abnormal vaginal bleeding -blood vessel disease or blood clots -breast, cervical, endometrial, ovarian, liver, or uterine cancer -dementia -diabetes -gallbladder disease -heart disease or recent heart attack -high blood pressure -high cholesterol -high level of calcium in the blood -hysterectomy -kidney disease -liver disease -migraine headaches -protein C deficiency -protein S deficiency -stroke -systemic lupus erythematosus (SLE) -tobacco smoker -an unusual or allergic reaction to estrogens, other hormones, medicines, foods, dyes, or preservatives -pregnant or trying to get pregnant -breast-feeding How should I use this medicine? Take this medicine by mouth. To reduce nausea, this medicine may be taken with food. Follow the directions on the prescription label. Take this medicine at the same time each day and in the order directed on the package. Do not take your medicine more often than directed. Contact your pediatrician regarding the use of this medicine in children. Special care may be needed. A patient package insert for the product will be given with each prescription and refill. Read this sheet carefully each time. The sheet may change frequently. Overdosage: If you think you have taken too much of this medicine contact a poison control center or emergency room at once. NOTE: This medicine is only for you. Do not share this medicine with others. What if I miss a dose? If you miss a dose, take it as soon as you can. If it is almost time for your next dose, take only that dose. Do not take double or extra doses. What may interact with this medicine? Do not take this medicine with any of the following medications: -aromatase inhibitors like  aminoglutethimide, anastrozole, exemestane, letrozole, testolactone This medicine may also interact with the following medications: -carbamazepine -certain antibiotics used to treat infections -certain barbiturates or benzodiazepines used for inducing sleep or treating seizures -grapefruit juice -medicines for fungus infections like itraconazole and ketoconazole -raloxifene or tamoxifen -rifabutin, rifampin, or rifapentine -ritonavir -St. John's Wort -warfarin This list may not describe all possible interactions. Give your health care provider a list of all the medicines, herbs, non-prescription drugs, or dietary supplements you use. Also tell them if you smoke, drink alcohol, or use illegal drugs. Some items may interact with your medicine. What should I watch for while using this medicine? Visit your doctor or health care professional for regular checks on your progress. You will need a regular breast and pelvic exam and Pap smear while on this medicine. You should also discuss the need for regular mammograms with your health care professional, and follow his or her guidelines for these tests. This medicine can make your body retain fluid, making your fingers, hands, or ankles swell. Your blood pressure can go up. Contact your doctor or  health care professional if you feel you are retaining fluid. If you have any reason to think you are pregnant, stop taking this medicine right away and contact your doctor or health care professional. Smoking increases the risk of getting a blood clot or having a stroke while you are taking this medicine, especially if you are more than 50 years old. You are strongly advised not to smoke. If you wear contact lenses and notice visual changes, or if the lenses begin to feel uncomfortable, consult your eye doctor or health care professional. This medicine can increase the risk of developing a condition (endometrial hyperplasia) that may lead to cancer of the lining  of the uterus. Taking progestins, another hormone drug, with this medicine lowers the risk of developing this condition. Therefore, if your uterus has not been removed (by a hysterectomy), your doctor may prescribe a progestin for you to take together with your estrogen. You should know, however, that taking estrogens with progestins may have additional health risks. You should discuss the use of estrogens and progestins with your health care professional to determine the benefits and risks for you. If you are going to have surgery, you may need to stop taking this medicine. Consult your health care professional for advice before you schedule the surgery. What side effects may I notice from receiving this medicine? Side effects that you should report to your doctor or health care professional as soon as possible: -allergic reactions like skin rash, itching or hives, swelling of the face, lips, or tongue -breast tissue changes or discharge -changes in vision -chest pain -confusion, trouble speaking or understanding -dark urine -general ill feeling or flu-like symptoms -light-colored stools -nausea, vomiting -pain, swelling, warmth in the leg -right upper belly pain -severe headaches -shortness of breath -sudden numbness or weakness of the face, arm or leg -trouble walking, dizziness, loss of balance or coordination -unusual vaginal bleeding -yellowing of the eyes or skin Side effects that usually do not require medical attention (report to your doctor or health care professional if they continue or are bothersome): -hair loss -increased hunger or thirst -increased urination -symptoms of vaginal infection like itching, irritation or unusual discharge -unusually weak or tired This list may not describe all possible side effects. Call your doctor for medical advice about side effects. You may report side effects to FDA at 1-800-FDA-1088. Where should I keep my medicine? Keep out of the reach  of children. Store at room temperature between 20 and 25 degrees C (68 and 77 degrees F). Keep container tightly closed. Protect from light. Throw away any unused medicine after the expiration date. NOTE: This sheet is a summary. It may not cover all possible information. If you have questions about this medicine, talk to your doctor, pharmacist, or health care provider.  2015, Elsevier/Gold Standard. (2010-09-15 74:94:49)  Estradiol vaginal tablets What is this medicine? ESTRADIOL (es tra DYE ole) vaginal tablet is used to help relieve symptoms of vaginal irritation and dryness that occurs in some women during menopause. This medicine may be used for other purposes; ask your health care provider or pharmacist if you have questions. COMMON BRAND NAME(S): Vagifem What should I tell my health care provider before I take this medicine? They need to know if you have any of these conditions: -abnormal vaginal bleeding -blood vessel disease or blood clots -breast, cervical, endometrial, ovarian, liver, or uterine cancer -dementia -diabetes -gallbladder disease -heart disease or recent heart attack -high blood pressure -high cholesterol -high level of calcium  in the blood -hysterectomy -kidney disease -liver disease -migraine headaches -protein C deficiency -protein S deficiency -stroke -systemic lupus erythematosus (SLE) -tobacco smoker -an unusual or allergic reaction to estrogens, other hormones, medicines, foods, dyes, or preservatives -pregnant or trying to get pregnant -breast-feeding How should I use this medicine? This medicine is only for use in the vagina. Do not take by mouth. Wash your hands before and after use. Read package directions carefully. Unwrap the pre-filled applicator package. Lie on your back, part and bend your knees. Gently insert the applicator tip high in the vagina and push the plunger to release the tablet into the vagina. Gently remove the applicator.  Throw away the applicator after use. Do not use your medicine more often than directed. Finish the full course prescribed by your doctor or health care professional even if you think your condition is better. Do not stop using except on the advice of your doctor or health care professional. Talk to your pediatrician regarding the use of this medicine in children. A patient package insert for the product will be given with each prescription and refill. Read this sheet carefully each time. The sheet may change frequently. Overdosage: If you think you have taken too much of this medicine contact a poison control center or emergency room at once. NOTE: This medicine is only for you. Do not share this medicine with others. What if I miss a dose? If you miss a dose, take it as soon as you can. If it is almost time for your next dose, take only that dose. Do not take double or extra doses. What may interact with this medicine? Do not take this medicine with any of the following medications: -aromatase inhibitors like aminoglutethimide, anastrozole, exemestane, letrozole, testolactone This medicine may also interact with the following medications: -antibiotics used to treat tuberculosis like rifabutin, rifampin and rifapentene -raloxifene or tamoxifen -warfarin This list may not describe all possible interactions. Give your health care provider a list of all the medicines, herbs, non-prescription drugs, or dietary supplements you use. Also tell them if you smoke, drink alcohol, or use illegal drugs. Some items may interact with your medicine. What should I watch for while using this medicine? Visit your health care professional for regular checks on your progress. You will need a regular breast and pelvic exam. You should also discuss the need for regular mammograms with your health care professional, and follow his or her guidelines. This medicine can make your body retain fluid, making your fingers,  hands, or ankles swell. Your blood pressure can go up. Contact your doctor or health care professional if you feel you are retaining fluid. If you have any reason to think you are pregnant; stop taking this medicine at once and contact your doctor or health care professional. Tobacco smoking increases the risk of getting a blood clot or having a stroke, especially if you are more than 50 years old. You are strongly advised not to smoke. If you wear contact lenses and notice visual changes, or if the lenses begin to feel uncomfortable, consult your eye care specialist. If you are going to have elective surgery, you may need to stop taking this medicine beforehand. Consult your health care professional for advice prior to scheduling the surgery. What side effects may I notice from receiving this medicine? Side effects that you should report to your doctor or health care professional as soon as possible: -allergic reactions like skin rash, itching or hives, swelling of the face, lips,  or tongue -breast tissue changes or discharge -changes in vision -chest pain -confusion, trouble speaking or understanding -dark urine -general ill feeling or flu-like symptoms -light-colored stools -nausea, vomiting -pain, swelling, warmth in the leg -right upper belly pain -severe headaches -shortness of breath -sudden numbness or weakness of the face, arm or leg -trouble walking, dizziness, loss of balance or coordination -unusual vaginal bleeding -yellowing of the eyes or skin Side effects that usually do not require medical attention (report to your doctor or health care professional if they continue or are bothersome): -hair loss -increased hunger or thirst -increased urination -symptoms of vaginal infection like itching, irritation or unusual discharge -unusually weak or tired This list may not describe all possible side effects. Call your doctor for medical advice about side effects. You may report  side effects to FDA at 1-800-FDA-1088. Where should I keep my medicine? Keep out of the reach of children. Store at room temperature between 15 and 30 degrees C (59 and 86 degrees F). Throw away any unused medicine after the expiration date. NOTE: This sheet is a summary. It may not cover all possible information. If you have questions about this medicine, talk to your doctor, pharmacist, or health care provider.  2015, Elsevier/Gold Standard. (2010-09-15 09:08:58)

## 2015-03-11 NOTE — Progress Notes (Signed)
Patient ID: Katie Bridges, female   DOB: 1966/06/09, 49 y.o.   MRN: [id number] 49 y.o. [id number] Married [place] American female here for annual exam.    Burning with intercourse.  [place] jelly not helping.  Soaps are now irritating on her bottom. Denies vaginal discharge. Obion 78.12 with PCP. Some hot flashes.  Sweating and feels like she has odor.   Some left labial throbbing 3 times in the last year.  No internal vaginal throbbing.   T cholesterol 243 and LDL 154.   HDL 65.  PCP:   Merry Lofty, MD  Patient's last menstrual period was 05/27/2008.          Sexually active: Yes.   female partner The current method of family planning is status post hysterectomy due to fibroids.   Ovaries remain. Exercising: Yes.    walking. Smoker:  no  Health Maintenance: Pap:  07-03-07 Neg History of abnormal Pap:  no MMG:  10-28-14 Density Cat.C/Bil.Diag. No evidence of malignancy;recommend screening 40yr[facility] Colonoscopy:  never BMD:   n/a  Result  n/a TDaP:  PCP Screening Labs:  Hb today: PCP, Urine today: Neg   reports that she has never smoked. She has never used smokeless tobacco. She reports that she does not drink alcohol or use illicit drugs.  Past Medical History  Diagnosis Date  . 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 Abnormal Pap smear 1988    unsure if had any treatment  . Fibroid   . Chronic edema   . Anemia     Past Surgical History  Procedure Laterality Date  . Laparoscopic total hysterectomy  05-27-08    robotic (fibroids)  . Cryotherapy  1988  . Breast biopsy  2001    benign  . Dilation and curettage of uterus  07/27/95    molar pregnancy  . Endometrial biopsy  02/19/08    normal    Current Outpatient Prescriptions  Medication Sig Dispense Refill  . Multiple Vitamins-Iron (MULTIVITAMIN/IRON PO) Take by mouth daily.     No current facility-administered medications for this visit.    History reviewed. No  pertinent family history.  ROS:  Pertinent items are noted in HPI.  Otherwise, a comprehensive ROS was negative.  Exam:   BP 100/68 mmHg  Pulse 76  Resp 20  Ht 5' 4.5" (1.638 m)  Wt 149 lb 9.6 oz (67.858 kg)  BMI 25.29 kg/m2  LMP 05/27/2008    General appearance: alert, cooperative and appears stated age Head: Normocephalic, without obvious abnormality, atraumatic Neck: no adenopathy, supple, symmetrical, trachea midline and thyroid normal to inspection and palpation Lungs: clear to auscultation bilaterally Breasts: normal appearance, no masses or tenderness, Inspection negative, No nipple retraction or dimpling, No nipple discharge or bleeding, No axillary or supraclavicular adenopathy Heart: regular rate and rhythm Abdomen: soft, non-tender; bowel sounds normal; no masses,  no organomegaly Extremities: extremities normal, atraumatic, no cyanosis or edema Skin: Skin color, texture, turgor normal. No rashes or lesions Lymph nodes: Cervical, supraclavicular, and axillary nodes normal. No abnormal inguinal nodes palpated Neurologic: Grossly normal  Pelvic: External genitalia:  no lesions              Urethra:  normal appearing urethra with no masses, tenderness or lesions              Bartholins and Skenes: normal                 Vagina:  normal appearing vagina with normal color and discharge, no lesions              Cervix: absent              Pap taken: No. Bimanual Exam:  Uterus:  uterus absent              Adnexa: normal adnexa and no mass, fullness, tenderness              Rectovaginal: Yes.  .  Confirms.              Anus:  normal sphincter tone, no lesions  Chaperone was present for exam.  Assessment:   Well woman visit with normal exam. Status post robotic total laparoscopic hysterectomy.  Ovaries remain.  Vulvovaginitis.  I believe this is mostly atrophy.  Menopausal symptoms.   Plan: Yearly mammogram recommended after age 22.  Recommended self breast exam.  Pap  and HR HPV as above. Discussed Calcium, Vitamin D, regular exercise program including cardiovascular and weight bearing exercise. Labs performed.  No..   See orders.  Affirm testing.  Refills given on medications.  Yes.    See orders.  Estrace oral tablet 1 mg daily.  Vagifem 10 mcg pv at hs for 2 weeks then twice weekly.  Discussed risks of DVT, PE, MI, and stroke.  Follow up annually and prn.     After visit summary provided.   __15_____ minutes face to face time of which over 50% was spent in counseling regarding vaginitis, vulvovaginal atrophy, and menopausal symptoms.

## 2015-03-12 ENCOUNTER — Telehealth: Payer: Self-pay

## 2015-03-12 LAB — WET PREP BY MOLECULAR PROBE
CANDIDA SPECIES: NEGATIVE
Gardnerella vaginalis: NEGATIVE
Trichomonas vaginosis: NEGATIVE

## 2015-03-12 NOTE — Telephone Encounter (Signed)
Patient notified see result note 

## 2015-03-12 NOTE — Telephone Encounter (Signed)
Called patient to discuss results of Affirm, left message to call patient.

## 2015-03-12 NOTE — Telephone Encounter (Signed)
-----   Message from Nunzio Cobbs, MD sent at 03/12/2015  7:04 AM EDT ----- Please report negative Affirm to patient.  Ok to start vaginal estrogen cream.

## 2015-03-14 ENCOUNTER — Encounter (HOSPITAL_BASED_OUTPATIENT_CLINIC_OR_DEPARTMENT_OTHER): Payer: Self-pay

## 2015-03-14 ENCOUNTER — Emergency Department (HOSPITAL_BASED_OUTPATIENT_CLINIC_OR_DEPARTMENT_OTHER)
Admission: EM | Admit: 2015-03-14 | Discharge: 2015-03-14 | Disposition: A | Discharge status: Home / Self Care | Payer: 59 | Attending: Emergency Medicine | Admitting: Emergency Medicine

## 2015-03-14 DIAGNOSIS — Z8639 Personal history of other endocrine, nutritional and metabolic disease: Secondary | ICD-10-CM | POA: Diagnosis not present

## 2015-03-14 DIAGNOSIS — Z862 Personal history of diseases of the blood and blood-forming organs and certain disorders involving the immune mechanism: Secondary | ICD-10-CM | POA: Insufficient documentation

## 2015-03-14 DIAGNOSIS — Z91040 Latex allergy status: Secondary | ICD-10-CM | POA: Diagnosis not present

## 2015-03-14 DIAGNOSIS — M545 Low back pain, unspecified: Secondary | ICD-10-CM

## 2015-03-14 DIAGNOSIS — Z86018 Personal history of other benign neoplasm: Secondary | ICD-10-CM | POA: Insufficient documentation

## 2015-03-14 LAB — URINALYSIS, ROUTINE W REFLEX MICROSCOPIC
Bilirubin Urine: NEGATIVE
GLUCOSE, UA: NEGATIVE mg/dL
HGB URINE DIPSTICK: NEGATIVE
KETONES UR: NEGATIVE mg/dL
Nitrite: NEGATIVE
PH: 5.5 (ref 5.0–8.0)
Protein, ur: NEGATIVE mg/dL
Specific Gravity, Urine: 1.015 (ref 1.005–1.030)
Urobilinogen, UA: 0.2 mg/dL (ref 0.0–1.0)

## 2015-03-14 LAB — URINE MICROSCOPIC-ADD ON

## 2015-03-14 MED ORDER — ONDANSETRON HCL 8 MG PO TABS: 8.0000 mg | ORAL_TABLET | Freq: Three times a day (TID) | ORAL | Status: DC | PRN

## 2015-03-14 MED ORDER — ONDANSETRON 8 MG PO TBDP
8.0000 mg | ORAL_TABLET | Freq: Once | ORAL | Status: AC
  Administered 2015-03-14: 8 mg via ORAL
  Filled 2015-03-14: qty 1

## 2015-03-14 MED ORDER — OXYCODONE-ACETAMINOPHEN 5-325 MG PO TABS: 1.0000 | ORAL_TABLET | Freq: Four times a day (QID) | ORAL | Status: DC | PRN

## 2015-03-14 MED ORDER — HYDROMORPHONE HCL 1 MG/ML IJ SOLN
2.0000 mg | Freq: Once | INTRAMUSCULAR | Status: AC
  Administered 2015-03-14: 2 mg via INTRAMUSCULAR
  Filled 2015-03-14: qty 2

## 2015-03-14 NOTE — ED Notes (Signed)
Pt c/o back pain that started yesterday.  Started on left lower side and is now bilateral lower back pain.  States she feels muscle spasms with movement.  No dysuria, no incontinence, no radiating pain, no numbness in her groin.

## 2015-03-14 NOTE — ED Provider Notes (Addendum)
CSN: [id number]     Arrival date & time 03/14/15  0542 History   First MD Initiated Contact with Patient 03/14/15 [phone]     Chief Complaint  Patient presents with  . Back Pain     (Consider location/radiation/quality/duration/timing/severity/associated sxs/prior Treatment) HPI  This is a 49 year old female who developed some mild back pain about 2 weeks ago. Yesterday her back pain returned. It has subsequently worsened and is now severe. It is worse with movement. It began in the left lower back but is now bilateral. It does not radiate to the buttocks or down the backs of the thighs. There is no associated numbness or weakness. There are no bowel or bladder changes. She describes the pain as feeling like spasms. She denies injury.  Past Medical History  Diagnosis Date  . 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 Abnormal Pap smear 1988    unsure if had any treatment  . Fibroid   . Chronic edema   . Anemia   . Hyperlipidemia 2016   Past Surgical History  Procedure Laterality Date  . Laparoscopic total hysterectomy  05-27-08    robotic (fibroids)  . Cryotherapy  1988  . Breast biopsy  2001    benign  . Dilation and curettage of uterus  07/27/95    molar pregnancy  . Endometrial biopsy  02/19/08    normal   No family history on file. Social History  Substance Use Topics  . Smoking status: Never Smoker   . Smokeless tobacco: Never Used  . Alcohol Use: No   OB History    Gravida Para Term Preterm AB TAB SAB Ectopic Multiple Living   2 0 0  2  2   0     Review of Systems  All other systems reviewed and are negative.   Allergies  Latex and Macrobid  Home Medications   Prior to Admission medications   Medication Sig Start Date End Date Taking? Authorizing Provider  estradiol (ESTRACE) 1 MG tablet Take 1 tablet (1 mg total) by mouth daily. 03/11/15   [place], MD  Estradiol 10 MCG TABS vaginal tablet Place tablet in  vagina (10 mcg) nightly for 2 weeks and then one tablet in vagina twice a week at night. 03/11/15   [place], MD  Multiple Vitamins-Iron (MULTIVITAMIN/IRON PO) Take by mouth daily.    Historical Provider, MD   BP 125/62 mmHg  Pulse 82  Temp(Src) 98.3 F (36.8 C) (Oral)  Resp 16  Ht 5\' 5"  (1.651 m)  Wt 149 lb (67.586 kg)  BMI 24.79 kg/m2  SpO2 99%  LMP 05/27/2008   Physical Exam  General: Well-developed, well-nourished female in no acute distress; appearance consistent with age of record HENT: normocephalic; atraumatic Eyes: pupils equal, round and reactive to light; extraocular muscles intact Neck: supple Heart: regular rate and rhythm Lungs: clear to auscultation bilaterally Abdomen: soft; nondistended Back: Nontender; no muscle spasms palpated; pain on movement of lower back; positive straight leg raise on the left at 45, positive straight leg raise on the right at 60 Extremities: No deformity; full range of motion except for hips; pulses normal Neurologic: Awake, alert and oriented; motor function intact in all extremities and symmetric; no facial droop Skin: Warm and dry Psychiatric: Flat affect    ED Course  Procedures (including critical care time)   MDM   Nursing notes and vitals signs, including pulse oximetry, reviewed.  Summary of this visit's results, reviewed by myself:  Labs:  Results for orders placed or performed during the hospital encounter of 03/14/15 (from the past 24 hour(s))  Urinalysis, Routine w reflex microscopic (not at [facility])     Status: Abnormal   Collection Time: 03/14/15  5:53 AM  Result Value Ref Range   Color, Urine YELLOW YELLOW   APPearance CLEAR CLEAR   Specific Gravity, Urine 1.015 1.005 - 1.030   pH 5.5 5.0 - 8.0   Glucose, UA NEGATIVE NEGATIVE mg/dL   Hgb urine dipstick NEGATIVE NEGATIVE   Bilirubin Urine NEGATIVE NEGATIVE   Ketones, ur NEGATIVE NEGATIVE mg/dL   Protein, ur NEGATIVE NEGATIVE mg/dL    Urobilinogen, UA 0.2 0.0 - 1.0 mg/dL   Nitrite NEGATIVE NEGATIVE   Leukocytes, UA MODERATE (A) NEGATIVE  Urine microscopic-add on     Status: Abnormal   Collection Time: 03/14/15  5:53 AM  Result Value Ref Range   Squamous Epithelial / LPF RARE RARE   WBC, UA 11-20 <3 WBC/hpf   RBC / HPF 0-2 <3 RBC/hpf   Bacteria, UA FEW (A) RARE   Urine sent for culture. No antibiotics given as current guidelines discourage treatment of asymptomatic pyuria.    Shanon Rosser, MD 03/14/15 [id number]  Shanon Rosser, MD 03/14/15 [phone]

## 2015-03-14 NOTE — ED Notes (Signed)
Pt verbalizes understanding of d/c instructions and denies any further needs at this time. 

## 2015-03-16 LAB — URINE CULTURE

## 2015-06-12 ENCOUNTER — Encounter: Payer: Self-pay | Admitting: Obstetrics and Gynecology

## 2015-06-12 ENCOUNTER — Ambulatory Visit (INDEPENDENT_AMBULATORY_CARE_PROVIDER_SITE_OTHER): Payer: 59 | Admitting: Obstetrics and Gynecology

## 2015-06-12 VITALS — BP 104/70 | HR 80 | Ht 64.5 in | Wt 150.0 lb

## 2015-06-12 DIAGNOSIS — Z79899 Other long term (current) drug therapy: Secondary | ICD-10-CM

## 2015-06-12 DIAGNOSIS — N951 Menopausal and female climacteric states: Secondary | ICD-10-CM

## 2015-06-12 DIAGNOSIS — N952 Postmenopausal atrophic vaginitis: Secondary | ICD-10-CM

## 2015-06-12 MED ORDER — ESTRADIOL 1 MG PO TABS: 1.0000 mg | ORAL_TABLET | Freq: Every day | ORAL | Status: DC

## 2015-06-12 MED ORDER — ESTRADIOL 10 MCG VA TABS: ORAL_TABLET | VAGINAL | Status: DC

## 2015-06-12 NOTE — Progress Notes (Signed)
Patient ID: Katie Bridges, female   DOB: October 09, 1965, 49 y.o.   MRN: [id number] GYNECOLOGY  VISIT   HPI: 49 y.o.   Married  [place] American  female   [phone] with Patient's last menstrual period was 05/27/2008.   here for 3 month follow up. Patient states feeling much better. On Vagifem twice a week and taking Estrace 1 mg orally.  No hot flashes.  No vaginal dryness. Insomnia is better.  Felt she was sleeping better when taking the Vagifem nightly for the first 2 weeks.   Not exercising regularly.  GYNECOLOGIC HISTORY: Patient's last menstrual period was 05/27/2008. Contraception:Hysterectomy Menopausal hormone therapy: Estrace 1mg  daily, Estradiol 84mcg pv twice weekly Last mammogram: 10-28-14 Density Cat.C/Bil.Diag.Neg/BiRads1/screening 39yr:TBC Last pap smear: 07-03-07 Neg        OB History    Gravida Para Term Preterm AB TAB SAB Ectopic Multiple Living   2 0 0  2  2   0         There are no active problems to display for this patient.   Past Medical History  Diagnosis Date  . 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 Abnormal Pap smear 1988    unsure if had any treatment  . Fibroid   . Chronic edema   . Anemia   . Hyperlipidemia 2016    Past Surgical History  Procedure Laterality Date  . Laparoscopic total hysterectomy  05-27-08    robotic (fibroids)  . Cryotherapy  1988  . Breast biopsy  2001    benign  . Dilation and curettage of uterus  07/27/95    molar pregnancy  . Endometrial biopsy  02/19/08    normal    Current Outpatient Prescriptions  Medication Sig Dispense Refill  . estradiol (ESTRACE) 1 MG tablet Take 1 tablet (1 mg total) by mouth daily. 30 tablet 3  . Estradiol 10 MCG TABS vaginal tablet Place tablet in vagina (10 mcg) nightly for 2 weeks and then one tablet in vagina twice a week at night. 18 tablet 11  . Multiple Vitamins-Iron (MULTIVITAMIN/IRON PO) Take by mouth daily.    . ondansetron (ZOFRAN) 8 MG tablet  Take 1 tablet (8 mg total) by mouth every 8 (eight) hours as needed for nausea or vomiting. 10 tablet 0  . oxyCODONE-acetaminophen (PERCOCET) 5-325 MG per tablet Take 1-2 tablets by mouth every 6 (six) hours as needed (for back pain). 20 tablet 0   No current facility-administered medications for this visit.     ALLERGIES: Latex and Macrobid  No family history on file.  Social History   Social History  . Marital Status: Married    Spouse Name: N/A  . Number of Children: N/A  . Years of Education: N/A   Occupational History  . Not on file.   Social History Main Topics  . Smoking status: Never Smoker   . Smokeless tobacco: Never Used  . Alcohol Use: No  . Drug Use: No  . Sexual Activity:    Partners: Male    Birth Control/ Protection: Surgical     Comment: R-TLH 2009   Other Topics Concern  . Not on file   Social History Narrative    ROS:  Pertinent items are noted in HPI.  PHYSICAL EXAMINATION:    BP 104/70 mmHg  Pulse 80  Ht 5' 4.5" (1.638 m)  Wt 150 lb (68.04 kg)  BMI 25.36 kg/m2  LMP 05/27/2008    General appearance:  alert, cooperative and appears stated age   Pelvic: External genitalia:  no lesions              Urethra:  normal appearing urethra with no masses, tenderness or lesions              Bartholins and Skenes: normal                 Vagina: normal appearing vagina with normal color and discharge, no lesions.  Pink mucosa.  Good rugae.              Cervix: absent        Bimanual Exam:  Uterus:  uterus absent              Adnexa: normal adnexa and no mass, fullness, tenderness               Chaperone was present for exam.  ASSESSMENT  Status post hysterectomy.  Menopausal symptoms and vaginal atrophy controlled with oral estradiol and Vagifem.   PLAN  Counseled regarding estrogen use.  Discussed WHI study and risks of DVT, PE, stroke.  Refill of Estrace 1 mg daily and Vagifem 10 mcg twice weekly for 9 months.  Discussed need to continue  yearly mammograms. Return for annual exam in September.    An After Visit Summary was printed and given to the patient.  __15____ minutes face to face time of which over 50% was spent in counseling.

## 2015-08-18 DIAGNOSIS — F411 Generalized anxiety disorder: Secondary | ICD-10-CM | POA: Insufficient documentation

## 2015-08-18 DIAGNOSIS — M545 Low back pain, unspecified: Secondary | ICD-10-CM | POA: Insufficient documentation

## 2015-08-18 DIAGNOSIS — N951 Menopausal and female climacteric states: Secondary | ICD-10-CM | POA: Insufficient documentation

## 2015-08-31 ENCOUNTER — Other Ambulatory Visit: Payer: Self-pay

## 2015-08-31 NOTE — Telephone Encounter (Signed)
Medication refill request: yuvafem & estradiol  Last AEX:  03/11/15 Next AEX: 03/25/16 Last MMG (if hormonal medication request): 10-28-14 Category C; Bi-Rads 1 Neg Refill authorized: Please approve or deny refill requests. Thank you.

## 2015-09-01 NOTE — Telephone Encounter (Signed)
Received fax from [facility] for refills on the patients yuvafem and estradiol. Both medications were written on 06-12-15, both having 8 refills. Request being refused due to the request being made too soon.

## 2015-09-03 ENCOUNTER — Other Ambulatory Visit: Payer: Self-pay | Admitting: *Deleted

## 2015-09-03 MED ORDER — ESTRADIOL 1 MG PO TABS: 1.0000 mg | ORAL_TABLET | Freq: Every day | ORAL | Status: DC

## 2015-09-03 MED ORDER — ESTRADIOL 10 MCG VA TABS: ORAL_TABLET | VAGINAL | Status: DC

## 2015-09-03 NOTE — Telephone Encounter (Signed)
Medication refill request: Yuvafem/ Estradiol 90 day request to mail in pharmacy  Last AEX:  03-11-15 Next AEX: 03-25-16 Last MMG (if hormonal medication request): 10-28-14 WNL Refill authorized: please advise

## 2015-10-15 ENCOUNTER — Other Ambulatory Visit: Payer: Self-pay

## 2015-10-15 DIAGNOSIS — Z1231 Encounter for screening mammogram for malignant neoplasm of breast: Secondary | ICD-10-CM

## 2015-11-03 ENCOUNTER — Ambulatory Visit
Admission: RE | Admit: 2015-11-03 | Discharge: 2015-11-03 | Disposition: A | Discharge status: Home / Self Care | Payer: 59 | Source: Ambulatory Visit

## 2015-11-03 DIAGNOSIS — Z1231 Encounter for screening mammogram for malignant neoplasm of breast: Secondary | ICD-10-CM

## 2015-11-04 ENCOUNTER — Telehealth: Payer: Self-pay | Admitting: Obstetrics and Gynecology

## 2015-11-04 ENCOUNTER — Other Ambulatory Visit: Payer: Self-pay | Admitting: Obstetrics and Gynecology

## 2015-11-04 DIAGNOSIS — R928 Other abnormal and inconclusive findings on diagnostic imaging of breast: Secondary | ICD-10-CM

## 2015-11-04 NOTE — Telephone Encounter (Signed)
Patient was seen for mammogram and was told she needed to come back for follow up exam. Patient was told that she needed to contact our office and get permission from Dr. Quincy Simmonds. Best # to reach: [phone]

## 2015-11-05 NOTE — Telephone Encounter (Signed)
Spoke with Areina at the [facility] who states that the orders for patient's diagnostic mammogram and ultrasound have been signed by Dr.Silva. The patient is set to be seen at the [facility] on 11/10/2015 at 2 pm. Notified the patient that everything has been completed by Dr.Silva for her appointment. The patient is agreeable and verbalizes understanding.  Routing to provider for final review. Patient agreeable to disposition. Will close encounter.

## 2015-11-10 ENCOUNTER — Ambulatory Visit
Admission: RE | Admit: 2015-11-10 | Discharge: 2015-11-10 | Disposition: A | Discharge status: Home / Self Care | Payer: 59 | Source: Ambulatory Visit | Attending: Obstetrics and Gynecology | Admitting: Obstetrics and Gynecology

## 2015-11-10 ENCOUNTER — Other Ambulatory Visit: Payer: Self-pay | Admitting: Obstetrics and Gynecology

## 2015-11-10 DIAGNOSIS — R928 Other abnormal and inconclusive findings on diagnostic imaging of breast: Secondary | ICD-10-CM

## 2015-11-10 DIAGNOSIS — N6320 Unspecified lump in the left breast, unspecified quadrant: Secondary | ICD-10-CM

## 2015-11-11 ENCOUNTER — Ambulatory Visit
Admission: RE | Admit: 2015-11-11 | Discharge: 2015-11-11 | Disposition: A | Discharge status: Home / Self Care | Payer: 59 | Source: Ambulatory Visit | Attending: Obstetrics and Gynecology | Admitting: Obstetrics and Gynecology

## 2015-11-11 ENCOUNTER — Other Ambulatory Visit: Payer: Self-pay | Admitting: Obstetrics and Gynecology

## 2015-11-11 DIAGNOSIS — N6320 Unspecified lump in the left breast, unspecified quadrant: Secondary | ICD-10-CM

## 2015-11-19 ENCOUNTER — Telehealth: Payer: Self-pay | Admitting: Obstetrics and Gynecology

## 2015-11-19 NOTE — Telephone Encounter (Signed)
Patient is questioning if she needs to continue with her estrogen medication after her recent MMG and ultrasound.

## 2015-11-20 NOTE — Telephone Encounter (Signed)
Call back to patient. Advised of Dr Elza Rafter response. Patient to call back if desires to discuss non-hormonal medication to treat symptoms.  Encounter closed.

## 2015-11-20 NOTE — Telephone Encounter (Signed)
Patient returned call. She has not taken Estrace or Vagifem in two weeks. She states she was scared with breast imaging results and dense breast results with need for possible biopsy.  She states she is aware of her risks as she has discussed with Dr. Quincy Simmonds.  She has increased her exercise and states her sleep is okay but she is having hot flashes now that make her uncomfortable.  She expresses that she does not think she wants to restart estrogen and is asking what Dr. Quincy Simmonds thinks about this and what recommendations for alternatives she could try.  She declines appointment to discuss at this time, if not necessary per Dr. Quincy Simmonds.  Advised will send her message and return call.  Patient agreeable.

## 2015-11-20 NOTE — Telephone Encounter (Signed)
I would recommend Estroven - soy and black cohosh. If the hot flashes are not manageable, please return to discuss [place] or Effexor.

## 2015-11-20 NOTE — Telephone Encounter (Signed)
Message left to return call to [place] at [phone].   Patient had recent diagnostic breast imaging at [facility]. Recommended 6 month follow up.   Patient on Estrace 1 mg po daily and vagifem for menopausal symptoms and vaginal atrophy.

## 2016-01-20 ENCOUNTER — Encounter: Payer: Self-pay | Admitting: Obstetrics and Gynecology

## 2016-01-20 ENCOUNTER — Ambulatory Visit (INDEPENDENT_AMBULATORY_CARE_PROVIDER_SITE_OTHER): Payer: 59 | Admitting: Obstetrics and Gynecology

## 2016-01-20 VITALS — BP 100/70 | Resp 20 | Ht 64.5 in

## 2016-01-20 DIAGNOSIS — N951 Menopausal and female climacteric states: Secondary | ICD-10-CM

## 2016-01-20 DIAGNOSIS — N952 Postmenopausal atrophic vaginitis: Secondary | ICD-10-CM

## 2016-01-20 DIAGNOSIS — N760 Acute vaginitis: Secondary | ICD-10-CM | POA: Diagnosis not present

## 2016-01-20 MED ORDER — ESTRADIOL 10 MCG VA TABS: 1.0000 | ORAL_TABLET | VAGINAL | 0 refills | Status: DC

## 2016-01-20 MED ORDER — ESTRADIOL 0.5 MG PO TABS: 0.5000 mg | ORAL_TABLET | Freq: Every day | ORAL | 0 refills | Status: DC

## 2016-01-20 NOTE — Progress Notes (Signed)
GYNECOLOGY  VISIT   HPI: 50 y.o.   Married  [place] American  female   [phone] with Patient's last menstrual period was 05/27/2008.   here to discuss recommendations for menopausal symptoms.  Patient complaining of hot flashes and vaginal dryness.  She was using Estrace 1mg  and Vagifem suppositories until she had a recall on her mammogram.  This made her stop both these medications.  She has since tried [place] and Vitamin E suppositories and vaginal lubricants without much relief.  She states she has vaginal burning even when taking a shower and soap hits vaginal area.  Vagifem worked to treat the vaginal burning.   Denies discharge.  Some vaginal odor.   Will have a dx mammogram in November with possible ultrasound.  Had a recall in May 2017 for a possible left breast mass which was not confirmed at the time of planned biopsy.  Biopsy plan was aborted when the mass was not confirmed.  Reports some enlargement under her left arm when she was taking the estrogen.  This felt tender.  It has resolved since stopping the estrogen.   GYNECOLOGIC HISTORY: Patient's last menstrual period was 05/27/2008. Contraception:  Hysterectomy Menopausal hormone therapy:  Estroven/Vitamin E suppositories Last mammogram:  11-03-15 see Epic Last pap smear:   07-03-07 Neg        OB History    Gravida Para Term Preterm AB Living   2 0 0   2 0   SAB TAB Ectopic Multiple Live Births   2                 There are no active problems to display for this patient.   Past Medical History:  Diagnosis Date  . Abnormal Pap smear 1988   unsure if had any treatment  . Anemia   . Chronic edema   . Fibroid   . 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 Hyperlipidemia 2016    Past Surgical History:  Procedure Laterality Date  . BREAST BIOPSY  2001   benign  . CRYOTHERAPY  1988  . DILATION AND CURETTAGE OF UTERUS  07/27/95   molar pregnancy  . ENDOMETRIAL BIOPSY  02/19/08    normal  . LAPAROSCOPIC TOTAL HYSTERECTOMY  05-27-08   robotic (fibroids)    Current Outpatient Prescriptions  Medication Sig Dispense Refill  . Multiple Vitamins-Iron (MULTIVITAMIN/IRON PO) Take by mouth daily.     No current facility-administered medications for this visit.      ALLERGIES: Latex and Macrobid [nitrofurantoin macrocrystal]  No family history on file.  Social History   Social History  . Marital status: Married    Spouse name: N/A  . Number of children: N/A  . Years of education: N/A   Occupational History  . Not on file.   Social History Main Topics  . Smoking status: Never Smoker  . Smokeless tobacco: Never Used  . Alcohol use No  . Drug use: No  . Sexual activity: Yes    Partners: Male    Birth control/ protection: Surgical     Comment: R-TLH 2009   Other Topics Concern  . Not on file   Social History Narrative  . No narrative on file    ROS:  Pertinent items are noted in HPI.  PHYSICAL EXAMINATION:    BP 100/70 (BP Location: Right Arm, Patient Position: Sitting)   Resp 20   Ht 5' 4.5" (1.638 m)   LMP 05/27/2008  General appearance: alert, cooperative and appears stated age    Pelvic: External genitalia:  no lesions              Urethra:  normal appearing urethra with no masses, tenderness or lesions              Bartholins and Skenes: normal                 Vagina: normal appearing vagina with normal color and discharge, no lesions              Cervix: absent           Bimanual Exam:  Uterus:  uterus absent              Adnexa: no mass, fullness, tenderness           Chaperone was present for exam.  ASSESSMENT  Menopausal symptoms.  Vaginal atrophy.  Possible bacterial vaginosis. Recent recall on mammogram with final reports negative.  PLAN  Comprehensive discussion of menopause and treatment of symptoms - hormone/estrogen replacement, SSRIs/SNRIs, herbal products.  Risks and benefits reviewed. Patient will return to  taking Estrace but lower dosage to 0.5 mg daily. We did discuss WHI study and potential increased risk of DVT, PE, stroke.  Possible increased risk of breast cancer or MI. Vaginitis discussed.  Affirm taken.  Will plan for return to local vaginal estrogen tx with Vagifem.   Mammogram reports reviewed. Follow up mammogram in November.  Annual exam this fall.    An After Visit Summary was printed and given to the patient.  __25____ minutes face to face time of which over 50% was spent in counseling.

## 2016-01-21 ENCOUNTER — Telehealth: Payer: Self-pay

## 2016-01-21 LAB — WET PREP BY MOLECULAR PROBE
Candida species: NEGATIVE
Gardnerella vaginalis: POSITIVE — AB
Trichomonas vaginosis: NEGATIVE

## 2016-01-21 NOTE — Telephone Encounter (Signed)
Left message to call Brynlea Spindler at [phone]. 

## 2016-01-21 NOTE — Telephone Encounter (Signed)
-----   Message from Nunzio Cobbs, MD sent at 01/21/2016  4:53 AM EDT ----- Please report Affirm results to patient showing bacterial vaginosis.  I am recommending Flagyl 500 mg po bid for one week.   Please send to pharmacy of choice. I still recommend the Vagifem and the oral estrogen to help with menopausal symptoms which are significant for the patient.   Cc- Marisa Sprinkles

## 2016-01-22 MED ORDER — METRONIDAZOLE 500 MG PO TABS: 500.0000 mg | ORAL_TABLET | Freq: Two times a day (BID) | ORAL | 0 refills | Status: DC

## 2016-01-22 NOTE — Telephone Encounter (Signed)
Returning a call to Kaitlyn. °

## 2016-01-22 NOTE — Telephone Encounter (Signed)
Spoke with patient. Advised of message as seen below from [place]. Patient is agreeable and verbalizes understanding. Rx for Flagyl 500 mg po bid x 7 days #14 0RF sent to pharmacy on file. ETOH precautions given. Patient verbalizes understanding.  Routing to provider for final review. Patient agreeable to disposition. Will close encounter.

## 2016-02-26 ENCOUNTER — Other Ambulatory Visit: Payer: Self-pay | Admitting: Obstetrics and Gynecology

## 2016-02-29 ENCOUNTER — Other Ambulatory Visit: Payer: Self-pay | Admitting: Obstetrics and Gynecology

## 2016-03-01 NOTE — Telephone Encounter (Signed)
Medication refill request: estrace  Last AEX:  03/11/15 Dr. Quincy Simmonds  Next AEX: 03/25/16  Last MMG (if hormonal medication request): 11/11/15 [place] Left  BIRADS3:Probably Benign  Refill authorized: 01/20/16 #[id number]/0R. Today please advise.

## 2016-03-24 NOTE — Progress Notes (Signed)
50 y.o. G43P0020 Married [place] American female here for annual exam.    Using Estrace 0.5 mg daily and Vagifem.  Happy on this regimen.  Still has some insomnia.  Not really having hot flashes any more.   Had cholesterol, glucose, and thyroid check with PCP.   Copy will be scanned in.  All normal except cholesterol which was 229 total and 147 LDL.  HDL 60. TG 86.  Wants to see a dietician.   States she has chronic rash of her upper extremities.  Saw dermatology years ago.  Issue never completely resolved.  Chronic right foot swelling.  Had negative work up.   PCP: Reita Cliche, MD - [facility].   Patient's last menstrual period was 05/27/2008.           Sexually active: Yes.   female The current method of family planning is status post hysterectomy--ovaries remain.    Exercising: No.  Does some walking Smoker:  no  Health Maintenance: Pap:  2009 normal History of abnormal Pap:  no MMG:  11-03-15 Density C/Poss.mass Lt.Br.,Rt.Br.neg./Diag.Lt.Br.and Lt.Br. [place] with Lt. Breast core biopsy recommended--no masses reproduced on Diag.mmg and Lt.Br.US. Rec.follow up Lt.Br.Diag. In 6 months for stability/BiRads3:[facility] Colonoscopy:  NEVER BMD:   n/a  Result  n/a TDaP:  PCP Gardasil:   N/A   Screening Labs:  Hb today: PCP, Urine today: Neg   reports that she has never smoked. She has never used smokeless tobacco. She reports that she does not drink alcohol or use drugs.  Past Medical History:  Diagnosis Date  . Abnormal Pap smear 1988   unsure if had any treatment  . Anemia   . Chronic edema   . Fibroid   . 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 Hyperlipidemia 2016    Past Surgical History:  Procedure Laterality Date  . BREAST BIOPSY  2001   benign  . CRYOTHERAPY  1988  . DILATION AND CURETTAGE OF UTERUS  07/27/95   molar pregnancy  . ENDOMETRIAL BIOPSY  02/19/08   normal  . LAPAROSCOPIC TOTAL HYSTERECTOMY  05-27-08   robotic (fibroids)    Current Outpatient Prescriptions  Medication Sig Dispense Refill  . estradiol (ESTRACE) 0.5 MG tablet Take 1 tablet (0.5 mg total) by mouth daily. 90 tablet 3  . [START ON 03/28/2016] Estradiol 10 MCG TABS vaginal tablet Place 1 tablet (10 mcg total) vaginally 2 (two) times a week. 24 tablet 3  . Multiple Vitamins-Iron (MULTIVITAMIN/IRON PO) Take by mouth daily.     No current facility-administered medications for this visit.     History reviewed. No pertinent family history.  ROS:  Pertinent items are noted in HPI.  Otherwise, a comprehensive ROS was negative.  Exam:   BP 110/68 (BP Location: Right Arm, Patient Position: Sitting, Cuff Size: Normal)   Pulse 80   Resp 20   Ht 5' 4.5" (1.638 m)   Wt 144 lb 9.6 oz (65.6 kg)   LMP 05/27/2008   BMI 24.44 kg/m     General appearance: alert, cooperative and appears stated age Head: Normocephalic, without obvious abnormality, atraumatic Neck: no adenopathy, supple, symmetrical, trachea midline and thyroid normal to inspection and palpation Lungs: clear to auscultation bilaterally Breasts: normal appearance, no masses or tenderness, No nipple retraction or dimpling, No nipple discharge or bleeding, No axillary or supraclavicular adenopathy Heart: regular rate and rhythm Abdomen: soft, non-tender; no masses, no organomegaly Extremities: extremities normal, atraumatic, no cyanosis or edema  Skin: Skin color, texture, turgor normal. Multiple pigmented areas.  Sebaceous cysts of the skin.  Lymph nodes: Cervical, supraclavicular, and axillary nodes normal. No abnormal inguinal nodes palpated Neurologic: Grossly normal  Pelvic: External genitalia:  no lesions              Urethra:  normal appearing urethra with no masses, tenderness or lesions              Bartholins and Skenes: normal                 Vagina: normal appearing vagina with normal color and discharge, no lesions              Cervix:  absent               Pap taken: No. Bimanual Exam:  Uterus:  absent              Adnexa: no mass, fullness, tenderness              Rectal exam: Yes.  .  Confirms.              Anus:  normal sphincter tone, no lesions  Chaperone was present for exam.  Assessment:   Well woman visit with normal exam. Status post robotic total laparoscopic hysterectomy.  Menopausal symptoms.  Elevated LDL cholesterol.  Chronic right foot edema.   Plan: Yearly mammogram recommended after age 58.  She will call to schedule her mammogram follow up for November.  Recommended self breast exam.  Pap and HR HPV as above. Guidelines for Calcium, Vitamin D, regular exercise program including cardiovascular and weight bearing exercise. Continue Estrace 0.5 mg daily and Vagifem twice weekly. Rx for one year. Discussed WHI study and increased risks of DVT, PE, and stroke. Referral to nutrition counseling.  Referral to GI for colonoscopy.  Referral to dermatology.  Follow up annually and prn.       After visit summary provided.

## 2016-03-25 ENCOUNTER — Encounter: Payer: Self-pay | Admitting: Obstetrics and Gynecology

## 2016-03-25 ENCOUNTER — Ambulatory Visit (INDEPENDENT_AMBULATORY_CARE_PROVIDER_SITE_OTHER): Payer: 59 | Admitting: Obstetrics and Gynecology

## 2016-03-25 VITALS — BP 110/68 | HR 80 | Resp 20 | Ht 64.5 in | Wt 144.6 lb

## 2016-03-25 DIAGNOSIS — E785 Hyperlipidemia, unspecified: Secondary | ICD-10-CM

## 2016-03-25 DIAGNOSIS — L989 Disorder of the skin and subcutaneous tissue, unspecified: Secondary | ICD-10-CM

## 2016-03-25 DIAGNOSIS — Z1211 Encounter for screening for malignant neoplasm of colon: Secondary | ICD-10-CM

## 2016-03-25 DIAGNOSIS — Z01419 Encounter for gynecological examination (general) (routine) without abnormal findings: Secondary | ICD-10-CM | POA: Diagnosis not present

## 2016-03-25 LAB — POCT URINALYSIS DIPSTICK
Bilirubin, UA: NEGATIVE
Blood, UA: NEGATIVE
Glucose, UA: NEGATIVE
Ketones, UA: NEGATIVE
LEUKOCYTES UA: NEGATIVE
Nitrite, UA: NEGATIVE
PH UA: 5
PROTEIN UA: NEGATIVE
UROBILINOGEN UA: NEGATIVE

## 2016-03-25 MED ORDER — ESTRADIOL 0.5 MG PO TABS: 0.5000 mg | ORAL_TABLET | Freq: Every day | ORAL | 3 refills | Status: DC

## 2016-03-25 MED ORDER — ESTRADIOL 10 MCG VA TABS: 1.0000 | ORAL_TABLET | VAGINAL | 3 refills | Status: DC

## 2016-03-25 NOTE — Patient Instructions (Signed)

## 2016-03-28 ENCOUNTER — Telehealth: Payer: Self-pay | Admitting: Obstetrics and Gynecology

## 2016-03-28 NOTE — Telephone Encounter (Signed)
Left voicemail regarding referral appointment. The information is listed below. Should the patient need to cancel or reschedule this appointment, please advise them to call the office they've been referred to in order to reschedule.  [facility] [address] [place] [address] [phone]  07/14/2016 @ 2pm. Arrive 15 minutes early with insurance card and photo id.   *please be aware patient is welcome to call their office for cancellations*

## 2016-05-30 ENCOUNTER — Telehealth: Payer: Self-pay | Admitting: *Deleted

## 2016-05-30 NOTE — Telephone Encounter (Signed)
Patient is 04 recall- she needs left breast DX MMG. Spoke with patient and she denied having [place] schedule her follow up DX MMG. She said she wants to call her insurance first to see if they will cover this first. Advised patient that we will be contacting her next week to follow up on the status. -eh

## 2016-06-23 NOTE — Telephone Encounter (Signed)
Spoke with patient regarding need to schedule left breast DX mmg. Patient states she will call and schedule appointment today.

## 2016-06-24 ENCOUNTER — Other Ambulatory Visit: Payer: Self-pay | Admitting: Obstetrics and Gynecology

## 2016-06-24 DIAGNOSIS — N63 Unspecified lump in unspecified breast: Secondary | ICD-10-CM

## 2016-06-29 NOTE — Telephone Encounter (Signed)
Patient is scheduled 07-05-16 for Left breast Dx mmg and Lt.br.U/S.at TBC.

## 2016-07-05 ENCOUNTER — Other Ambulatory Visit: Payer: 59

## 2016-07-08 NOTE — Telephone Encounter (Signed)
Patient cancelled appointment on 07/01/16 due to cough and advised she would call back and r/s when feeling better.

## 2016-07-27 ENCOUNTER — Telehealth: Payer: Self-pay | Admitting: *Deleted

## 2016-07-27 NOTE — Telephone Encounter (Signed)
Patient in 04 recall 05/2016. Patient had an appointment  07/01/16 however she cancelled it due to being sick. (see phone note)  Please contact patient to reschedule

## 2016-07-28 NOTE — Telephone Encounter (Signed)
Spoke with patient. Advised I have spoken with the [place] who placed me in contact with with Sabrina at the [facility] program through [facility] to get recommendations of how to facilitate breast imaging. Advised I have left a message for a return call from [place] with further information. Patient is agreeable. Aware of recommendations to discontinue Vagifem at this time until breast imaging is completed. Patient is agreeable. Patient states she just filled her Estrace tablets for $300 asking if she needs to discontinue these as well or if can take temporarily until imaging. Advised as this is an Estrogen product will need to stop taking this as well. Advised I will review with Dr.Silva and return call if any further recommendations.  Dr.Silva, do you agree?

## 2016-07-28 NOTE — Telephone Encounter (Signed)
Called patient to schedule follow up Lt.Breast DX MMG and possible Lt.Br.U/S. Patient states she called her Ins.Co.and they will not cover imaging and she cannot afford--she does not know what to do.  Advised will let Dr.Silva know and call her back. She also states her G.Vagifem went up from $20 co-pay to $300. It was helping but she cannot afford this either. Will let Dr.Silva know. Routed to provider.

## 2016-07-28 NOTE — Telephone Encounter (Signed)
If she is going to have any delay in proceeding with breast imaging, then I recommend stopping all hormonal treatment.  The best plan is to proceed forward with the evaluation!

## 2016-07-28 NOTE — Telephone Encounter (Signed)
Return call to Kaitlyn. °

## 2016-07-28 NOTE — Telephone Encounter (Signed)
Left message to call Kaitlyn at [phone]. 

## 2016-07-28 NOTE — Telephone Encounter (Signed)
I would recommend she call the [facility] and discuss her concerns regarding cost. She can ask them what is possible to assist her in this regard.  I do not recommend refilling her hormonal medication without completing her breast imaging.  We need to know if we are doing the right thing by having her continue on a medication that has some potential to stimulate breast tissue.   Cc- Marisa Sprinkles

## 2016-07-29 NOTE — Telephone Encounter (Signed)
Spoke with patient. Advised of message as seen below from [place]. Patient is agreeable and verbalizes understanding. Advised I will continue working to speak with the [facility] program for assistance with recommendations and scheduling and will return call with update. Patient is agreeable.  Call to the [facility] program. Left message for Gabriel Cirri to return call to office for further assistance in scheduling.

## 2016-08-02 NOTE — Telephone Encounter (Signed)
Left message for Rolena Infante at the [facility] program requesting return call regarding assistance with helping patient get scheduled for mammogram.

## 2016-08-04 NOTE — Telephone Encounter (Signed)
Spoke with Anderson Malta at the [facility] for further advice and recommendations as I have been unable to get in touch with the [facility] program. Per Weston Anna patient is not eligible for [facility] program as she does have insurance coverage. Per Anderson Malta patient can be set up on payment plan with the Breast Center. Patient wil need to call the Breast Center to schedule, insurance will be billed for imaging, once the patient has received her bill for imaging the [place] can set up a payment plan.  Left message for patient to call [place] at [phone].

## 2016-08-05 NOTE — Telephone Encounter (Signed)
Spoke with patient. Advised of information received from the [place] as seen below. Patient verbalizes understanding. Patient states that she has contacted [facility] in [facility] and is scheduled to have L diagnostic mammogram and ultrasound on 08/08/2016. She has requested results be sent to Dr.Silva and has had her records transferred from the [place]. Recall edited to 08/08/2016.  Routing to Dr.Silva for review before closing.

## 2016-08-05 NOTE — Telephone Encounter (Signed)
Thank you for the update.  Please continue to keep in mammogram hold.

## 2016-08-05 NOTE — Telephone Encounter (Signed)
Patient placed in mammogram hold.  Routing to provider for final review. Patient agreeable to disposition. Will close encounter.

## 2016-08-12 ENCOUNTER — Encounter: Payer: Self-pay | Admitting: Obstetrics and Gynecology

## 2016-11-23 ENCOUNTER — Encounter: Payer: Self-pay | Admitting: Obstetrics and Gynecology

## 2017-02-27 ENCOUNTER — Other Ambulatory Visit: Payer: Self-pay | Admitting: Obstetrics and Gynecology

## 2017-02-28 NOTE — Telephone Encounter (Signed)
Medication refill request: OCP  Last AEX:  03-25-16  Next AEX: 04-05-17  Last MMG (if hormonal medication request): 11-09-16- recommend DX MMG next yr Refill authorized: please advise

## 2017-04-03 NOTE — Progress Notes (Signed)
51 y.o. G5P0020 Married [place] American female here for annual exam.    Stopped taking oral estrogen.  She would get swelling under her arms.  Feels like it is affecting her mammograms.  She is using the Vagifem but is is very expensive.   States she is having episodes of anxiety and almost feels like panic. Sleeping better then last couple of weeks.  No depression symptoms. No prior tx of anxiety or depression.   Labs with PCP.   PCP:   Reita Cliche, MD  Patient's last menstrual period was 05/27/2008.           Sexually active: Yes.   female The current method of family planning is status post hysterectomy--ovaries remain.    Exercising: No.   Smoker:  no  Health Maintenance: Pap: 2009 normal History of abnormal Pap:  no MMG: 11-09-16 Bil.Diag.d/t Lt.Br.asymmetry--Density C/Neg/BiRads3/probably benign:[facility] Colonoscopy: NEVER.   BMD:   n/a  Result  n/a TDaP: with PCP Gardasil:   no YSA:YTKZS Hep C:Never Screening Labs:  Hb today: PCP, Urine today: not done   reports that she has never smoked. She has never used smokeless tobacco. She reports that she does not drink alcohol or use drugs.  Past Medical History:  Diagnosis Date  . Abnormal Pap smear 1988   unsure if had any treatment  . Anemia   . Chronic edema   . Edema of right foot   . Fibroid   . 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 Hyperlipidemia 2016    Past Surgical History:  Procedure Laterality Date  . BREAST BIOPSY  2001   benign  . CRYOTHERAPY  1988  . DILATION AND CURETTAGE OF UTERUS  07/27/95   molar pregnancy  . ENDOMETRIAL BIOPSY  02/19/08   normal  . LAPAROSCOPIC TOTAL HYSTERECTOMY  05-27-08   robotic (fibroids)    Current Outpatient Prescriptions  Medication Sig Dispense Refill  . Multiple Vitamins-Iron (MULTIVITAMIN/IRON PO) Take by mouth daily.    Merril Abbe 10 MCG TABS vaginal tablet INSERT 1 TABLET VAGINALLY  TWICE A WEEK 24 tablet 0  .  estradiol (ESTRACE) 0.5 MG tablet Take 1 tablet (0.5 mg total) by mouth daily. (Patient not taking: Reported on 04/05/2017) 90 tablet 3   No current facility-administered medications for this visit.     No family history on file.  ROS:  Pertinent items are noted in HPI.  Otherwise, a comprehensive ROS was negative.  Exam:   BP 114/62 (BP Location: Right Arm, Patient Position: Sitting, Cuff Size: Normal)   Pulse 80   Resp 18   Ht 5\' 4"  (1.626 m)   Wt 148 lb (67.1 kg)   LMP 05/27/2008   BMI 25.40 kg/m     General appearance: alert, cooperative and appears stated age Head: Normocephalic, without obvious abnormality, atraumatic Neck: no adenopathy, supple, symmetrical, trachea midline and thyroid normal to inspection and palpation Lungs: clear to auscultation bilaterally Breasts: normal appearance, no masses or tenderness, No nipple retraction or dimpling, No nipple discharge or bleeding, No axillary or supraclavicular adenopathy Heart: regular rate and rhythm Abdomen: soft, non-tender; no masses, no organomegaly Extremities: extremities normal, atraumatic, no cyanosis or edema Skin: Skin color, texture, turgor normal. No rashes or lesions Lymph nodes: Cervical, supraclavicular, and axillary nodes normal. No abnormal inguinal nodes palpated Neurologic: Grossly normal  Pelvic: External genitalia:  no lesions              Urethra:  normal appearing urethra with no masses, tenderness or lesions              Bartholins and Skenes: normal                 Vagina: normal appearing vagina with normal color and discharge, no lesions              Cervix:  Absent.  Some scar palpable along left vaginal apex.               Pap taken: No. Bimanual Exam:  Uterus:  Absent.              Adnexa: no mass, fullness, tenderness              Rectal exam: Yes.  .  Confirms.              Anus:  normal sphincter tone, no lesions  Chaperone was present for exam.  Assessment:   Well woman visit with  normal exam. Menopausal symptoms.  Anxiety and panic.  Plan: Mammogram screening discussed.  She is due for dx mammogram in May 2018.  She is place in mammogram recall. Recommended self breast awareness. Pap and HR HPV as above. Guidelines for Calcium, Vitamin D, regular exercise program including cardiovascular and weight bearing exercise. Start Paxil.  Discussed serotonin syndrome and potential weight gain and decreased libido as well as benefits for treating hot flashes and anxiety/panic. Rx for vaginal estradiol compounded cream 0.02%, 1/2 gram pv at hs twice weekly for one year.  Colonoscopy recommended.  Information on Cologuard to patient. Recheck in 6 weeks.  Follow up annually and prn.     After visit summary provided.

## 2017-04-05 ENCOUNTER — Encounter: Payer: Self-pay | Admitting: Obstetrics and Gynecology

## 2017-04-05 ENCOUNTER — Ambulatory Visit (INDEPENDENT_AMBULATORY_CARE_PROVIDER_SITE_OTHER): Payer: 59 | Admitting: Obstetrics and Gynecology

## 2017-04-05 VITALS — BP 114/62 | HR 80 | Resp 18 | Ht 64.0 in | Wt 148.0 lb

## 2017-04-05 DIAGNOSIS — Z01419 Encounter for gynecological examination (general) (routine) without abnormal findings: Secondary | ICD-10-CM | POA: Diagnosis not present

## 2017-04-05 DIAGNOSIS — N951 Menopausal and female climacteric states: Secondary | ICD-10-CM

## 2017-04-05 DIAGNOSIS — F419 Anxiety disorder, unspecified: Secondary | ICD-10-CM | POA: Diagnosis not present

## 2017-04-05 MED ORDER — NONFORMULARY OR COMPOUNDED ITEM: 11 refills | Status: DC

## 2017-04-05 MED ORDER — PAROXETINE HCL 10 MG PO TABS: 10.0000 mg | ORAL_TABLET | ORAL | 1 refills | Status: DC

## 2017-04-05 NOTE — Patient Instructions (Signed)
EXERCISE AND DIET:  We recommended that you start or continue a regular exercise program for good health. Regular exercise means any activity that makes your heart beat faster and makes you sweat.  We recommend exercising at least 30 minutes per day at least 3 days a week, preferably 4 or 5.  We also recommend a diet low in fat and sugar.  Inactivity, poor dietary choices and obesity can cause diabetes, heart attack, stroke, and kidney damage, among others.    ALCOHOL AND SMOKING:  Women should limit their alcohol intake to no more than 7 drinks/beers/glasses of wine (combined, not each!) per week. Moderation of alcohol intake to this level decreases your risk of breast cancer and liver damage. And of course, no recreational drugs are part of a healthy lifestyle.  And absolutely no smoking or even second hand smoke. Most people know smoking can cause heart and lung diseases, but did you know it also contributes to weakening of your bones? Aging of your skin?  Yellowing of your teeth and nails?  CALCIUM AND VITAMIN D:  Adequate intake of calcium and Vitamin D are recommended.  The recommendations for exact amounts of these supplements seem to change often, but generally speaking 600 mg of calcium (either carbonate or citrate) and 800 units of Vitamin D per day seems prudent. Certain women may benefit from higher intake of Vitamin D.  If you are among these women, your doctor will have told you during your visit.    PAP SMEARS:  Pap smears, to check for cervical cancer or precancers,  have traditionally been done yearly, although recent scientific advances have shown that most women can have pap smears less often.  However, every woman still should have a physical exam from her gynecologist every year. It will include a breast check, inspection of the vulva and vagina to check for abnormal growths or skin changes, a visual exam of the cervix, and then an exam to evaluate the size and shape of the uterus and  ovaries.  And after 52 years of age, a rectal exam is indicated to check for rectal cancers. We will also provide age appropriate advice regarding health maintenance, like when you should have certain vaccines, screening for sexually transmitted diseases, bone density testing, colonoscopy, mammograms, etc.   MAMMOGRAMS:  All women over 40 years old should have a yearly mammogram. Many facilities now offer a "3D" mammogram, which may cost around $50 extra out of pocket. If possible,  we recommend you accept the option to have the 3D mammogram performed.  It both reduces the number of women who will be called back for extra views which then turn out to be normal, and it is better than the routine mammogram at detecting truly abnormal areas.    COLONOSCOPY:  Colonoscopy to screen for colon cancer is recommended for all women at age 50.  We know, you hate the idea of the prep.  We agree, BUT, having colon cancer and not knowing it is worse!!  Colon cancer so often starts as a polyp that can be seen and removed at colonscopy, which can quite literally save your life!  And if your first colonoscopy is normal and you have no family history of colon cancer, most women don't have to have it again for 10 years.  Once every ten years, you can do something that may end up saving your life, right?  We will be happy to help you get it scheduled when you are ready.    Be sure to check your insurance coverage so you understand how much it will cost.  It may be covered as a preventative service at no cost, but you should check your particular policy.     Paroxetine tablets What is this medicine? PAROXETINE (pa ROX e teen) is used to treat depression. It may also be used to treat anxiety disorders, obsessive compulsive disorder, panic attacks, post traumatic stress, and premenstrual dysphoric disorder (PMDD). This medicine may be used for other purposes; ask your health care provider or pharmacist if you have  questions. COMMON BRAND NAME(S): Paxil, Pexeva What should I tell my health care provider before I take this medicine? They need to know if you have any of these conditions: -bipolar disorder or a family history of bipolar disorder -bleeding disorders -glaucoma -heart disease -kidney disease -liver disease -low levels of sodium in the blood -seizures -suicidal thoughts, plans, or attempt; a previous suicide attempt by you or a family member -take MAOIs like Carbex, Eldepryl, Marplan, Nardil, and Parnate -take medicines that treat or prevent blood clots -thyroid disease -an unusual or allergic reaction to paroxetine, other medicines, foods, dyes, or preservatives -pregnant or trying to get pregnant -breast-feeding How should I use this medicine? Take this medicine by mouth with a glass of water. Follow the directions on the prescription label. You can take it with or without food. Take your medicine at regular intervals. Do not take your medicine more often than directed. Do not stop taking this medicine suddenly except upon the advice of your doctor. Stopping this medicine too quickly may cause serious side effects or your condition may worsen. A special MedGuide will be given to you by the pharmacist with each prescription and refill. Be sure to read this information carefully each time. Talk to your pediatrician regarding the use of this medicine in children. Special care may be needed. Overdosage: If you think you have taken too much of this medicine contact a poison control center or emergency room at once. NOTE: This medicine is only for you. Do not share this medicine with others. What if I miss a dose? If you miss a dose, take it as soon as you can. If it is almost time for your next dose, take only that dose. Do not take double or extra doses. What may interact with this medicine? Do not take this medicine with any of the following medications: -linezolid -MAOIs like Carbex,  Eldepryl, Marplan, Nardil, and Parnate -methylene blue (injected into a vein) -pimozide -thioridazine This medicine may also interact with the following medications: -alcohol -amphetamines -aspirin and aspirin-like medicines -atomoxetine -certain medicines for depression, anxiety, or psychotic disturbances -certain medicines for irregular heart beat like propafenone, flecainide, encainide, and quinidine -certain medicines for migraine headache like almotriptan, eletriptan, frovatriptan, naratriptan, rizatriptan, sumatriptan, zolmitriptan -cimetidine -digoxin -diuretics -fentanyl -fosamprenavir -furazolidone -isoniazid -lithium -medicines that treat or prevent blood clots like warfarin, enoxaparin, and dalteparin -medicines for sleep -NSAIDs, medicines for pain and inflammation, like ibuprofen or naproxen -phenobarbital -phenytoin -procarbazine -rasagiline -ritonavir -supplements like St. John's wort, kava kava, valerian -tamoxifen -tramadol -tryptophan This list may not describe all possible interactions. Give your health care provider a list of all the medicines, herbs, non-prescription drugs, or dietary supplements you use. Also tell them if you smoke, drink alcohol, or use illegal drugs. Some items may interact with your medicine. What should I watch for while using this medicine? Tell your doctor if your symptoms do not get better or if they get worse. Visit  your doctor or health care professional for regular checks on your progress. Because it may take several weeks to see the full effects of this medicine, it is important to continue your treatment as prescribed by your doctor. Patients and their families should watch out for new or worsening thoughts of suicide or depression. Also watch out for sudden changes in feelings such as feeling anxious, agitated, panicky, irritable, hostile, aggressive, impulsive, severely restless, overly excited and hyperactive, or not being able  to sleep. If this happens, especially at the beginning of treatment or after a change in dose, call your health care professional. Dennis Bast may get drowsy or dizzy. Do not drive, use machinery, or do anything that needs mental alertness until you know how this medicine affects you. Do not stand or sit up quickly, especially if you are an older patient. This reduces the risk of dizzy or fainting spells. Alcohol may interfere with the effect of this medicine. Avoid alcoholic drinks. Your mouth may get dry. Chewing sugarless gum or sucking hard candy, and drinking plenty of water will help. Contact your doctor if the problem does not go away or is severe. What side effects may I notice from receiving this medicine? Side effects that you should report to your doctor or health care professional as soon as possible: -allergic reactions like skin rash, itching or hives, swelling of the face, lips, or tongue -anxious -black, tarry stools -changes in vision -confusion -elevated mood, decreased need for sleep, racing thoughts, impulsive behavior -eye pain -fast, irregular heartbeat -feeling faint or lightheaded, falls -feeling agitated, angry, or irritable -hallucination, loss of contact with reality -loss of balance or coordination -loss of memory -painful or prolonged erections -restlessness, pacing, inability to keep still -seizures -stiff muscles -suicidal thoughts or other mood changes -trouble sleeping -unusual bleeding or bruising -unusually weak or tired -vomiting Side effects that usually do not require medical attention (report to your doctor or health care professional if they continue or are bothersome): -change in appetite or weight -change in sex drive or performance -diarrhea -dizziness -dry mouth -increased sweating -indigestion, nausea -tired -tremors This list may not describe all possible side effects. Call your doctor for medical advice about side effects. You may report  side effects to FDA at 1-800-FDA-1088. Where should I keep my medicine? Keep out of the reach of children. Store at room temperature between 15 and 30 degrees C (59 and 86 degrees F). Keep container tightly closed. Throw away any unused medicine after the expiration date. NOTE: This sheet is a summary. It may not cover all possible information. If you have questions about this medicine, talk to your doctor, pharmacist, or health care provider.  2018 Elsevier/Gold Standard (2015-11-14 15:50:32)

## 2017-05-17 ENCOUNTER — Ambulatory Visit (INDEPENDENT_AMBULATORY_CARE_PROVIDER_SITE_OTHER): Payer: 59 | Admitting: Obstetrics and Gynecology

## 2017-05-17 ENCOUNTER — Encounter: Payer: Self-pay | Admitting: Obstetrics and Gynecology

## 2017-05-17 VITALS — BP 122/84 | HR 80 | Ht 64.0 in | Wt 147.0 lb

## 2017-05-17 DIAGNOSIS — F419 Anxiety disorder, unspecified: Secondary | ICD-10-CM | POA: Diagnosis not present

## 2017-05-17 MED ORDER — PAROXETINE HCL 20 MG PO TABS: 20.0000 mg | ORAL_TABLET | ORAL | 11 refills | Status: DC

## 2017-05-17 NOTE — Progress Notes (Signed)
GYNECOLOGY  VISIT   HPI: 51 y.o.   Married  [place] American  female   [phone] with Patient's last menstrual period was 05/27/2008.   here for follow up.   Using vaginal estrogen cream and it is compounded.  Atrophy symptoms controlled.   Using Paxil. Feels a slight reduction in anxiety. Hot flashed reduced.  Sleeping well.  No change in libido.   Still having anxiety and panic.  This occurs when she is around people.  Feels better at home.   Denies suicidal ideation.  GYNECOLOGIC HISTORY: Patient's last menstrual period was 05/27/2008. Contraception:  Hysterectomy--ovaries remain Menopausal hormone therapy:  Estradiol cream Last mammogram:  11-09-16 Bil.Diag.d/t Lt.Br.asymmetry--Density C/Neg/BiRads3/probably benign:[facility] Last pap smear: 2009 Neg        OB History    Gravida Para Term Preterm AB Living   2 0 0   2 0   SAB TAB Ectopic Multiple Live Births   2                 There are no active problems to display for this patient.   Past Medical History:  Diagnosis Date  . Abnormal Pap smear 1988   unsure if had any treatment  . Anemia   . Chronic edema   . Edema of right foot   . Fibroid   . 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 Hyperlipidemia 2016    Past Surgical History:  Procedure Laterality Date  . BREAST BIOPSY  2001   benign  . CRYOTHERAPY  1988  . DILATION AND CURETTAGE OF UTERUS  07/27/95   molar pregnancy  . ENDOMETRIAL BIOPSY  02/19/08   normal  . LAPAROSCOPIC TOTAL HYSTERECTOMY  05-27-08   robotic (fibroids)    Current Outpatient Medications  Medication Sig Dispense Refill  . Multiple Vitamins-Iron (MULTIVITAMIN/IRON PO) Take by mouth daily.    . NONFORMULARY OR COMPOUNDED ITEM Vaginal estradiol cream, 0.02%.  Place 1/2 gram per vagina at bedtime twice weekly.  Dispense 8 gram tube, RF 11. 8 each 11  . PARoxetine (PAXIL) 10 MG tablet Take 1 tablet (10 mg total) by mouth every morning. 30  tablet 1   No current facility-administered medications for this visit.      ALLERGIES: Latex and Macrobid [nitrofurantoin macrocrystal]  No family history on file.  Social History   Socioeconomic History  . Marital status: Married    Spouse name: Not on file  . Number of children: Not on file  . Years of education: Not on file  . Highest education level: Not on file  Social Needs  . Financial resource strain: Not on file  . Food insecurity - worry: Not on file  . Food insecurity - inability: Not on file  . Transportation needs - medical: Not on file  . Transportation needs - non-medical: Not on file  Occupational History  . Not on file  Tobacco Use  . Smoking status: Never Smoker  . Smokeless tobacco: Never Used  Substance and Sexual Activity  . Alcohol use: No    Alcohol/week: 0.0 oz  . Drug use: No  . Sexual activity: Yes    Partners: Male    Birth control/protection: Surgical    Comment: R-TLH 2009  Other Topics Concern  . Not on file  Social History Narrative  . Not on file    ROS:  Pertinent items are noted in HPI.  PHYSICAL EXAMINATION:    BP 122/84 (BP Location:  Right Arm, Patient Position: Sitting, Cuff Size: Normal)   Pulse 80   Ht 5\' 4"  (1.626 m)   Wt 147 lb (66.7 kg)   LMP 05/27/2008   BMI 25.23 kg/m     General appearance: alert, cooperative and appears stated age  ASSESSMENT  Panic disorder.  Agoraphobia?  PLAN  Increase Paxil to 20 mg daily.  If no response, will refer to psychiatrist.  I gave her a card for Marya Amsler at [facility] for her to start counseling.  Follow up prn.  She will let me know how the increased dosage is doing.    An After Visit Summary was printed and given to the patient.  _15____ minutes face to face time of which over 50% was spent in counseling.

## 2018-01-25 DIAGNOSIS — R490 Dysphonia: Secondary | ICD-10-CM | POA: Insufficient documentation

## 2018-01-25 DIAGNOSIS — J358 Other chronic diseases of tonsils and adenoids: Secondary | ICD-10-CM | POA: Insufficient documentation

## 2018-01-25 DIAGNOSIS — H608X3 Other otitis externa, bilateral: Secondary | ICD-10-CM | POA: Insufficient documentation

## 2018-04-16 NOTE — Progress Notes (Signed)
52 y.o. G60P0020 Married [place] American female here for annual exam.    Sweating a lot in vaginal area.  Taking [employer] B, a probiotic.   No night sweats.  Some hot flashes but not as much.   Now is exercising.   Not taking Paxil. Made some decisions, and feeling much better.  PCP does labs.   PCP:  Reita Cliche, MD   Patient's last menstrual period was 05/27/2008.           Sexually active: Yes.   female The current method of family planning is status post hysterectomy--ovaries remain.    Exercising: No.   Smoker:  no  Health Maintenance: Pap: 2009 normal History of abnormal Pap:  no  MMG: 11/09/16 - dx bilateral mammogram normal. [facility].   Had dx bilateral mammogram in July 2019 [facility] - BI-RADS2.  See care Everywhere.  Colonoscopy:  NEVER.    BMD:   n/a  Result  n/a TDaP:  PCP Gardasil:   no HIV:  Had an insurance physical. Hep C:  NA. Screening Labs:  Hb today: PCP    reports that she has never smoked. She has never used smokeless tobacco. She reports that she does not drink alcohol or use drugs.  Past Medical History:  Diagnosis Date  . Abnormal Pap smear 1988   unsure if had any treatment  . Anemia   . Chronic edema   . Edema of right foot   . Fibroid   . 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 Hyperlipidemia 2016    Past Surgical History:  Procedure Laterality Date  . BREAST BIOPSY  2001   benign  . CRYOTHERAPY  1988  . DILATION AND CURETTAGE OF UTERUS  07/27/95   molar pregnancy  . ENDOMETRIAL BIOPSY  02/19/08   normal  . LAPAROSCOPIC TOTAL HYSTERECTOMY  05-27-08   robotic (fibroids)    Current Outpatient Medications  Medication Sig Dispense Refill  . Multiple Vitamins-Iron (MULTIVITAMIN/IRON PO) Take by mouth daily.    . NONFORMULARY OR COMPOUNDED ITEM Vaginal estradiol cream, 0.02%.  Place 1/2 gram per vagina at bedtime twice weekly.  Dispense 8 gram tube, RF 11. 8 each 11   No current  facility-administered medications for this visit.     History reviewed. No pertinent family history.  Review of Systems  All other systems reviewed and are negative.   Exam:   BP 122/80 (BP Location: Right Arm, Patient Position: Sitting, Cuff Size: Normal)   Pulse 84   Resp 20   Ht 5\' 3"  (1.6 m)   Wt 153 lb 9.6 oz (69.7 kg)   LMP 05/27/2008   BMI 27.21 kg/m     General appearance: alert, cooperative and appears stated age Head: Normocephalic, without obvious abnormality, atraumatic Neck: no adenopathy, supple, symmetrical, trachea midline and thyroid normal to inspection and palpation Lungs: clear to auscultation bilaterally Breasts: right - normal appearance, no masses or tenderness, No nipple retraction or dimpling, No nipple discharge or bleeding, No axillary or supraclavicular adenopathy Left - normal appearance, no masses or tenderness, No nipple retraction or dimpling, No nipple discharge or bleeding, No supraclavicular adenopathy.  Left axillary region with 2.5 cm mass - node? Heart: regular rate and rhythm Abdomen: soft, non-tender; no masses, no organomegaly Extremities: extremities normal, atraumatic, no cyanosis or edema Skin: Skin color, texture, turgor normal. No rashes or lesions Lymph nodes: Cervical, supraclavicular, and axillary nodes normal. No abnormal inguinal nodes palpated Neurologic: Grossly  normal  Pelvic: External genitalia:  no lesions              Urethra:  normal appearing urethra with no masses, tenderness or lesions              Bartholins and Skenes: normal                 Vagina: normal appearing vagina with normal color and discharge, no lesions              Cervix:  absent              Pap taken: No. Bimanual Exam:  Uterus:  absent              Adnexa: no mass, fullness, tenderness              Rectal exam: Yes.  .  Confirms.              Anus:  normal sphincter tone, no lesions  Chaperone was present for exam.  Assessment:   Well woman  visit with normal exam. Menopausal symptoms. Mild.  Left axillary fullness.   Plan: Left axillary [place], dx left mammogram and left breast US at [facility].  Recommended self breast awareness. Pap and HR HPV as above. Guidelines for Calcium, Vitamin D, regular exercise program including cardiovascular and weight bearing exercise. IFOB.  Follow up annually and prn.   After visit summary provided.

## 2018-04-18 ENCOUNTER — Other Ambulatory Visit: Payer: Self-pay

## 2018-04-18 ENCOUNTER — Ambulatory Visit (INDEPENDENT_AMBULATORY_CARE_PROVIDER_SITE_OTHER): Payer: 59 | Admitting: Obstetrics and Gynecology

## 2018-04-18 ENCOUNTER — Encounter: Payer: Self-pay | Admitting: Obstetrics and Gynecology

## 2018-04-18 VITALS — BP 122/80 | HR 84 | Resp 20 | Ht 63.0 in | Wt 153.6 lb

## 2018-04-18 DIAGNOSIS — R2232 Localized swelling, mass and lump, left upper limb: Secondary | ICD-10-CM

## 2018-04-18 DIAGNOSIS — Z1211 Encounter for screening for malignant neoplasm of colon: Secondary | ICD-10-CM

## 2018-04-18 DIAGNOSIS — Z01419 Encounter for gynecological examination (general) (routine) without abnormal findings: Secondary | ICD-10-CM

## 2018-04-18 NOTE — Patient Instructions (Signed)

## 2018-04-20 ENCOUNTER — Telehealth: Payer: Self-pay | Admitting: Obstetrics and Gynecology

## 2018-04-20 NOTE — Telephone Encounter (Signed)
Please confirm dates of diagnostic studies for evaluation of left axillary fullness/mass I detected on exam on 04/16/18. I ordered a left axillary ultrasound.  My expectation is that the imaging center will likely also want a left breast ultrasound and possible dx left mammogram.  Patient preference is to go [facility].

## 2018-04-20 NOTE — Telephone Encounter (Signed)
Thank you for the update. I have signed the order for the testing.

## 2018-04-20 NOTE — Telephone Encounter (Signed)
Dr. Quincy Simmonds, she is currently scheduled for 04/25/18 at 11:00.   I am waiting for a call back from the Breast Nurse Navigator for [facility] to see if the radiologist will recommend diagnostic breast imaging again.  The nurse was going to speak with the radiologist today. The patient's insurance does not cover any of the diagnostic breast imaging so she requested to know ahead of time what her  costs will be.

## 2018-05-28 ENCOUNTER — Other Ambulatory Visit: Payer: Self-pay

## 2018-05-28 NOTE — Telephone Encounter (Signed)
Medication refill request: estradial cream 0.02 % Last AEX:  04/18/18 Next AEX: 04/26/19 Last MMG (if hormonal medication request): 11/09/16 Bi-rads 3 probably benign  Refill authorized:  8grams with 11 RF

## 2018-05-29 MED ORDER — NONFORMULARY OR COMPOUNDED ITEM: 11 refills | Status: AC

## 2018-05-29 NOTE — Telephone Encounter (Signed)
Copy of report pending scan. Out of imaging hold on 05/09/18.  To Dr. Elza Rafter workstation.  Rx signed by Dr. Sabra Heck and faxed to [facility].  Pt requests Rx as soon as possible.  Notified Rx sent. Pt agreeable.   Electronic Copy from [facility]:   Katie Bridges - 52 y.o. Female; born Aug. 08, 1967August 09-08-64 [facility] MAMMOGRAM DIAGNOSTIC LEFT W TOMO Routine 04/25/2018 11:15 AM EDT Lump of axillary tail of left breast  Results for this procedure are in the    MG MAMMOGRAM DIAGNOSTIC LEFT W TOMO (04/25/2018 11:15 AM EDT) Narrative Performed At CLINICAL DATA: 52 year old female presenting for evaluation of a  palpable area of concern in the left axilla. She states this has  been present for 2 years, but recently increasing in size.    EXAM:  DIGITAL DIAGNOSTIC LEFT MAMMOGRAM WITH CAD AND TOMO    ULTRASOUND LEFT BREAST    COMPARISON: Previous exam(s).    ACR Breast Density Category c: The breast tissue is heterogeneously  dense, which may obscure small masses.    FINDINGS:  A BB has been placed over the left axilla indicating the palpable  site of concern. There are no suspicious mammographic findings deep  to the marker. No suspicious calcifications, masses or areas of  distortion are seen in the left breast.    Mammographic images were processed with CAD.    Physical exam of the palpable site in the left axilla demonstrates  no discrete palpable masses. The patient has had that she notices it  most when standing with her arm over her head. She identifies a soft  protruding fat pad. No for masses are identified in this site.    Ultrasound targeted to the left axilla demonstrates normal  subcutaneous tissue. No masses or suspicious areas of shadowing are  identified.    IMPRESSION:  1. The palpable site of concern in the left axilla appears to  correspond with a small fat pad. No suspicious mammographic or  targeted sonographic  abnormalities are identified at this site.    RECOMMENDATION:  Return to routine screening mammography is recommended. The patient  will be due for screening in July of 2020.    I have discussed the findings and recommendations with the patient.  Results were also provided in writing at the conclusion of the  visit. If applicable, a reminder letter will be sent to the patient  regarding the next appointment.    BI-RADS CATEGORY 1: Negative.      Electronically Signed  By: Ammie Ferrier M.D.  On: 04/25/2018 11:49

## 2018-05-29 NOTE — Telephone Encounter (Signed)
Please get copy of diagnostic testing from 2019 regarding diagnostic axillary/breast imaging requested due to left axillary mass noted on physical exam on 04/18/18.    Rx for estrogen cream declined at this time until evaluation is complete.  [place]

## 2018-08-27 ENCOUNTER — Encounter: Payer: Self-pay | Admitting: Obstetrics and Gynecology

## 2019-03-01 ENCOUNTER — Telehealth: Payer: Self-pay | Admitting: Obstetrics and Gynecology

## 2019-03-01 NOTE — Telephone Encounter (Signed)
Left message on voicemail to call and reschedule cancelled appointment. °

## 2019-04-26 ENCOUNTER — Ambulatory Visit: Payer: 59 | Admitting: Obstetrics and Gynecology

## 2021-02-08 ENCOUNTER — Ambulatory Visit (INDEPENDENT_AMBULATORY_CARE_PROVIDER_SITE_OTHER): Payer: 59 | Admitting: Podiatry

## 2021-02-08 ENCOUNTER — Other Ambulatory Visit: Payer: Self-pay

## 2021-02-08 ENCOUNTER — Ambulatory Visit: Payer: 59

## 2021-02-08 ENCOUNTER — Encounter: Payer: Self-pay | Admitting: Podiatry

## 2021-02-08 DIAGNOSIS — I872 Venous insufficiency (chronic) (peripheral): Secondary | ICD-10-CM | POA: Diagnosis not present

## 2021-02-08 DIAGNOSIS — M216X2 Other acquired deformities of left foot: Secondary | ICD-10-CM | POA: Diagnosis not present

## 2021-02-08 DIAGNOSIS — M722 Plantar fascial fibromatosis: Secondary | ICD-10-CM

## 2021-02-08 DIAGNOSIS — M21862 Other specified acquired deformities of left lower leg: Secondary | ICD-10-CM

## 2021-02-08 DIAGNOSIS — M21861 Other specified acquired deformities of right lower leg: Secondary | ICD-10-CM

## 2021-02-08 DIAGNOSIS — M216X1 Other acquired deformities of right foot: Secondary | ICD-10-CM

## 2021-02-08 MED ORDER — MELOXICAM 15 MG PO TABS
15.0000 mg | ORAL_TABLET | Freq: Every day | ORAL | 3 refills | Status: DC
Start: 2021-02-08 — End: 2021-03-05

## 2021-02-08 NOTE — Patient Instructions (Signed)

## 2021-02-09 NOTE — Progress Notes (Signed)
  Subjective:  Patient ID: Katie Bridges, female    DOB: [birth year],  MRN: [id number]  Chief Complaint  Patient presents with   Plantar Fasciitis    )(np) Heel pain/spurs    55 y.o. female presents with the above complaint. History confirmed with patient.  She was referred by her PCP to [place] they had taken x-rays for her.  Its worse first thing in the morning has been going on for a few weeks now.  She also has swelling in both feet and legs  Objective:  Physical Exam: warm, good capillary refill, no trophic changes or ulcerative lesions, normal DP and PT pulses, normal sensory exam, and bilaterally she has +1 peripheral edema and varicose veins forming. Left Foot: point tenderness over the heel pad and point tenderness of the mid plantar fascia Right Foot: point tenderness over the heel pad and point tenderness of the mid plantar fascia  No images are attached to the encounter.  Radiographs: Unable to view her radiographs of the bilateral foot but report shows: no fracture, dislocation, swelling or degenerative changes noted and plantar calcaneal spur with right greater than left Assessment:   1. Plantar fasciitis, bilateral   2. 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 Gastrocnemius equinus of left lower extremity   4. Gastrocnemius equinus of right lower extremity      Plan:  Patient was evaluated and treated and all questions answered.  Suspect she has early venous insufficiency and discussed using compression stockings elevating the legs and I referred her to vascular surgery for evaluation and discuss what other options would be available  Discussed the etiology and treatment options for plantar fasciitis including stretching, formal physical therapy, supportive shoegears such as a running shoe or sneaker, pre fabricated orthoses, injection therapy, and oral medications. We also discussed the role of surgical treatment of this for patients  who do not improve after exhausting non-surgical treatment options.   -XR reviewed with patient -Educated patient on stretching and icing of the affected limb -Night splint dispensed x1 -Plantar fascial brace dispensed for bilateral foot -Injection delivered to the plantar fascia of both feet. -Rx for meloxicam. Educated on use, risks and benefits of the medication  After sterile prep with povidone-iodine solution and alcohol, the bilateral heel was injected with 0.5cc 2% xylocaine plain, 0.5cc 0.5% marcaine plain, '5mg'$  triamcinolone acetonide, and '2mg'$  dexamethasone was injected along the medial plantar fascia at the insertion on the plantar calcaneus. The patient tolerated the procedure well without complication.    Return in about 6 weeks (around 03/22/2021) for recheck plantar fasciitis.

## 2021-02-24 NOTE — Progress Notes (Signed)
55 y.o. G30P0020 Married [place] American female here for annual exam.    Having vaginal dryness.  Dove soap burns.  Stopped estrogen due to left axillary fullness which worsened with use.  Her imaging evaluation was benign.   Having anxiety and panic. Took Paxil in the past, but did not continue.  No problems with side effects on chart review.  She has a voice tremor, and will see neurology for this on April 02, 2021.  Taking Meloxicam for bone spurs.   PCP:   Reita Cliche, MD  Patient's last menstrual period was 05/27/2008.           Sexually active: No.  The current method of family planning is none.    Exercising: No.  Smoker:  no  Health Maintenance: Pap:  2009 normal History of abnormal Pap:  no MMG:  05-14-20 normal Colonoscopy:  never.   BMD:   never  Result   TDaP:  PCP Gardasil:   no [id number]. Physical neg Hep C:no Screening Labs:  Hb today: PCP, Urine today: PCP   reports that she has never smoked. She has never used smokeless tobacco. She reports that she does not drink alcohol and does not use drugs.  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 30 tablet 3   Multiple Vitamins-Iron (MULTIVITAMIN/IRON PO) Take by mouth daily.     rosuvastatin (CRESTOR) 5 MG tablet Take 5 mg by mouth daily.     NONFORMULARY OR COMPOUNDED ITEM  Vaginal estradiol cream, 0.02%.  Place 1/2 gram per vagina at bedtime twice weekly.  Dispense 8 gram tube, RF 11. (Patient not taking: Reported on 03/05/2021) 8 each 11   No current facility-administered medications for this visit.    History reviewed. No pertinent family history.  Review of Systems  All other systems reviewed and are negative.  Exam:   BP 116/76 (Cuff Size: Normal)   Pulse 90   Ht '5\' 4"'$  (1.626 m)   Wt 154 lb (69.9 kg)   LMP 05/27/2008   SpO2 100%   BMI 26.43 kg/m     General appearance: alert, cooperative and appears stated age Head: normocephalic, without obvious abnormality, atraumatic Neck: no adenopathy, supple, symmetrical, trachea midline and thyroid normal to inspection and palpation Lungs: clear to auscultation bilaterally Breasts: normal appearance, no masses or tenderness, No nipple retraction or dimpling, No nipple discharge or bleeding, No axillary adenopathy.  Fullness of left axillary region (old change). Heart: regular rate and rhythm Abdomen: soft, non-tender; no masses, no organomegaly Extremities: extremities normal, atraumatic, no cyanosis or edema Skin: skin color, texture, turgor normal. No rashes or lesions Lymph nodes: cervical, supraclavicular, and axillary nodes normal. Neurologic: grossly normal  Pelvic: External genitalia:  no lesions  No abnormal inguinal nodes palpated.              Urethra:  normal appearing urethra with no masses, tenderness or lesions              Bartholins and Skenes: normal                 Vagina: atrophy noted.               Cervix: absent              Pap taken:  no Bimanual Exam:  Uterus:  absent              Adnexa: no mass, fullness, tenderness              Rectal exam: yes.  Confirms.              Anus:  normal sphincter tone, no lesions  Chaperone was present for exam:  Onalee Hua, CMA  Assessment:   Well woman visit with gynecologic exam. Status post laparoscopic hysterectomy.  Atrophy  of vagina.  Left axillary fullness with negative work up.  Anxiety and panic.  Plan: Mammogram screening discussed. Self breast awareness reviewed. Pap and HR HPV as above. Guidelines for Calcium, Vitamin D, regular exercise program including cardiovascular and weight bearing exercise. Vit E vaginal suppositories and cooking oils for vaginal atrophy. We discussed colonoscopy and Cologuard for colon cancer screening.  She will consider these. We discussed potential increased risk of GI bleeding with NSAID and SSRI use. Stop Meloxicam per patient choice and start Paxil 10 mg daily.   Fu for recheck in 6 weeks.  Follow up annually and prn.    After visit summary provided.

## 2021-03-05 ENCOUNTER — Encounter: Payer: Self-pay | Admitting: Obstetrics and Gynecology

## 2021-03-05 ENCOUNTER — Ambulatory Visit (INDEPENDENT_AMBULATORY_CARE_PROVIDER_SITE_OTHER): Payer: 59 | Admitting: Obstetrics and Gynecology

## 2021-03-05 ENCOUNTER — Other Ambulatory Visit: Payer: Self-pay

## 2021-03-05 VITALS — BP 116/76 | HR 90 | Ht 64.0 in | Wt 154.0 lb

## 2021-03-05 DIAGNOSIS — F419 Anxiety disorder, unspecified: Secondary | ICD-10-CM | POA: Diagnosis not present

## 2021-03-05 DIAGNOSIS — Z01419 Encounter for gynecological examination (general) (routine) without abnormal findings: Secondary | ICD-10-CM

## 2021-03-05 MED ORDER — PAROXETINE HCL 10 MG PO TABS: 10.0000 mg | ORAL_TABLET | ORAL | 1 refills | Status: DC

## 2021-03-05 NOTE — Patient Instructions (Signed)

## 2021-03-22 ENCOUNTER — Ambulatory Visit (INDEPENDENT_AMBULATORY_CARE_PROVIDER_SITE_OTHER): Payer: 59 | Admitting: Podiatry

## 2021-03-22 ENCOUNTER — Other Ambulatory Visit: Payer: Self-pay

## 2021-03-22 DIAGNOSIS — M722 Plantar fascial fibromatosis: Secondary | ICD-10-CM

## 2021-03-22 DIAGNOSIS — M21862 Other specified acquired deformities of left lower leg: Secondary | ICD-10-CM

## 2021-03-22 DIAGNOSIS — M216X2 Other acquired deformities of left foot: Secondary | ICD-10-CM

## 2021-03-22 DIAGNOSIS — M21861 Other specified acquired deformities of right lower leg: Secondary | ICD-10-CM

## 2021-03-22 DIAGNOSIS — M216X1 Other acquired deformities of right foot: Secondary | ICD-10-CM

## 2021-03-26 NOTE — Progress Notes (Signed)
  Subjective:  Patient ID: Katie Bridges, female    DOB: 1966-03-13,  MRN: [id number]  Chief Complaint  Patient presents with   Plantar Fasciitis     recheck plantar fasciitis    55 y.o. female presents with the above complaint. History confirmed with patient.  She has had some improvement.  The night splint has been difficult to use.  Objective:  Physical Exam: warm, good capillary refill, no trophic changes or ulcerative lesions, normal DP and PT pulses, normal sensory exam, and bilaterally she has +1 peripheral edema and varicose veins forming. Left Foot: point tenderness over the heel pad and point tenderness of the mid plantar fascia Right Foot: point tenderness over the heel pad and point tenderness of the mid plantar fascia  No images are attached to the encounter.  Radiographs: Unable to view her radiographs of the bilateral foot but report shows: no fracture, dislocation, swelling or degenerative changes noted and plantar calcaneal spur with right greater than left Assessment:   1. Plantar fasciitis, bilateral   2. Gastrocnemius equinus of right lower extremity   3. Gastrocnemius equinus of left lower extremity      Plan:  Patient was evaluated and treated and all questions answered.  Suspect she has early venous insufficiency and discussed using compression stockings elevating the legs and I referred her to vascular surgery for evaluation and discuss what other options would be available  Discussed the etiology and treatment options for plantar fasciitis including stretching, formal physical therapy, supportive shoegears such as a running shoe or sneaker, pre fabricated orthoses, injection therapy, and oral medications. We also discussed the role of surgical treatment of this for patients who do not improve after exhausting non-surgical treatment options.   -XR reviewed with patient -Continue stretching and icing of the affected limb -Continue using night   -Continue meloxicam -Referral to physical [facility] at the [facility] sent      Return in about 8 weeks (around 05/17/2021) for recheck plantar fasciitis.

## 2021-04-20 ENCOUNTER — Telehealth: Payer: Self-pay

## 2021-04-20 NOTE — Telephone Encounter (Signed)
Patient called because at her last visit [facility] 03/05/2021 Dr. Quincy Simmonds prescribed Paxil and asked her to f/u in six weeks.  She cancelled that follow appt because she saw Neurologist for her voice tremors and he prescribed Propranolol 10 mg. And recommended it was best she stop the Paxil and she has d/c'd it.  I told patient I would let Dr. Quincy Simmonds know at her request.

## 2021-04-21 ENCOUNTER — Ambulatory Visit: Payer: 59 | Admitting: Obstetrics and Gynecology

## 2021-04-21 NOTE — Telephone Encounter (Signed)
Encounter reviewed and closed.  Thank you.

## 2021-04-22 ENCOUNTER — Ambulatory Visit: Payer: 59 | Admitting: Obstetrics and Gynecology

## 2021-05-17 ENCOUNTER — Ambulatory Visit (INDEPENDENT_AMBULATORY_CARE_PROVIDER_SITE_OTHER): Payer: 59 | Admitting: Podiatry

## 2021-05-17 ENCOUNTER — Other Ambulatory Visit: Payer: Self-pay

## 2021-05-17 DIAGNOSIS — M722 Plantar fascial fibromatosis: Secondary | ICD-10-CM

## 2021-05-17 DIAGNOSIS — M21862 Other specified acquired deformities of left lower leg: Secondary | ICD-10-CM

## 2021-05-17 DIAGNOSIS — M62461 Contracture of muscle, right lower leg: Secondary | ICD-10-CM

## 2021-05-17 DIAGNOSIS — M21861 Other specified acquired deformities of right lower leg: Secondary | ICD-10-CM

## 2021-05-17 DIAGNOSIS — M62462 Contracture of muscle, left lower leg: Secondary | ICD-10-CM

## 2021-05-17 NOTE — Progress Notes (Signed)
  Subjective:  Patient ID: Katie Bridges, female    DOB: 10-14-1965,  MRN: [id number]  Chief Complaint  Patient presents with   Plantar Fasciitis    8 week follow up    55 y.o. female presents with the above complaint. History confirmed with patient.  Physical pain is back to where it was when we started, she had to have a tooth extracted and removed and due to being out for surgery she was not able to start physical therapy  Objective:  Physical Exam: warm, good capillary refill, no trophic changes or ulcerative lesions, normal DP and PT pulses, normal sensory exam, and bilaterally she has +1 peripheral edema and varicose veins forming. Left Foot: point tenderness over the heel pad and point tenderness of the mid plantar fascia Right Foot: point tenderness over the heel pad and point tenderness of the mid plantar fascia  No images are attached to the encounter.  Radiographs: Unable to view her radiographs of the bilateral foot but report shows: no fracture, dislocation, swelling or degenerative changes noted and plantar calcaneal spur with right greater than left Assessment:   1. Plantar fasciitis, bilateral   2. Gastrocnemius equinus of right lower extremity   3. Gastrocnemius equinus of left lower extremity      Plan:  Patient was evaluated and treated and all questions answered.  Suspect she has early venous insufficiency and discussed using compression stockings elevating the legs and I referred her to vascular surgery for evaluation and discuss what other options would be available  Discussed the etiology and treatment options for plantar fasciitis including stretching, formal physical therapy, supportive shoegears such as a running shoe or sneaker, pre fabricated orthoses, injection therapy, and oral medications. We also discussed the role of surgical treatment of this for patients who do not improve after exhausting non-surgical treatment options.   -Unfortunate feels  like we have regressed some.  I recommend we repeat injections today which were performed as noted below.  Resume meloxicam.  She will call physical therapy is scheduled now that her mouth is doing better.  I will see her back in 8 weeks   After sterile prep with povidone-iodine solution and alcohol, the bilateral heel was injected with 0.5cc 2% xylocaine plain, 0.5cc 0.5% marcaine plain, 5mg  triamcinolone acetonide, and 2mg  dexamethasone was injected along the medial plantar fascia at the insertion on the plantar calcaneus. The patient tolerated the procedure well without complication.      Return in about 8 weeks (around 07/12/2021) for recheck plantar fasciitis.

## 2021-05-17 NOTE — Patient Instructions (Signed)

## 2021-05-18 NOTE — Addendum Note (Signed)
Addended bySherryle Lis, Marnesha Gagen R on: 05/18/2021 12:25 PM   Modules accepted: Orders

## 2021-06-01 ENCOUNTER — Encounter: Payer: Self-pay | Admitting: Physical Therapy

## 2021-06-01 ENCOUNTER — Other Ambulatory Visit: Payer: Self-pay

## 2021-06-01 ENCOUNTER — Ambulatory Visit: Payer: 59 | Attending: Podiatry | Admitting: Physical Therapy

## 2021-06-01 DIAGNOSIS — M25671 Stiffness of right ankle, not elsewhere classified: Secondary | ICD-10-CM | POA: Diagnosis present

## 2021-06-01 DIAGNOSIS — M722 Plantar fascial fibromatosis: Secondary | ICD-10-CM | POA: Insufficient documentation

## 2021-06-01 DIAGNOSIS — M25672 Stiffness of left ankle, not elsewhere classified: Secondary | ICD-10-CM | POA: Insufficient documentation

## 2021-06-01 DIAGNOSIS — R600 Localized edema: Secondary | ICD-10-CM | POA: Insufficient documentation

## 2021-06-01 DIAGNOSIS — R252 Cramp and spasm: Secondary | ICD-10-CM | POA: Insufficient documentation

## 2021-06-01 DIAGNOSIS — M21861 Other specified acquired deformities of right lower leg: Secondary | ICD-10-CM | POA: Diagnosis not present

## 2021-06-01 DIAGNOSIS — M21862 Other specified acquired deformities of left lower leg: Secondary | ICD-10-CM | POA: Insufficient documentation

## 2021-06-01 DIAGNOSIS — M79672 Pain in left foot: Secondary | ICD-10-CM | POA: Insufficient documentation

## 2021-06-01 DIAGNOSIS — M79671 Pain in right foot: Secondary | ICD-10-CM | POA: Insufficient documentation

## 2021-06-01 NOTE — Patient Instructions (Signed)
Access Code: [email or web address] URL: [email or web address] Date: 06/01/2021 Prepared by: Almyra Free  Exercises Standing Heel Raises - 1 x daily - 7 x weekly - 2 sets - 10 reps - 3 sec hold

## 2021-06-01 NOTE — Therapy (Signed)
[facility] [address]  [place], [place], [address] Phone: [phone]   Fax:  [phone]  Physical Therapy Evaluation  Patient Details  Name: Katie Bridges MRN: [id number] Date of Birth: [birth year] Referring Provider (PT): Lanae Crumbly DPM   Encounter Date: 06/01/2021   PT End of Session - 06/01/21 1258     Visit Number 1    Date for PT Re-Evaluation 07/13/21    Authorization Type U[employer]    PT Start Time 1300    PT Stop Time 1350    PT Time Calculation (min) 50 min    Activity Tolerance Patient tolerated treatment well    Behavior During Therapy [facility] for tasks assessed/performed             Past Medical History:  Diagnosis Date   Abnormal Pap smear 1988   unsure if had any treatment   Anemia    Chronic edema    Edema of right foot    Fibroid    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    Subjective Assessment - 06/01/21 1302     Subjective In August 2022, she awoke in the morning and both feet were hurting. Had xray. Doesn't hurt when walking but when she stops. Or if sits for a while and then gets up it hurts. Pain is worse in the morning    How long can you stand comfortably? 1 hour    Diagnostic tests xrays: small calcaneal spurs which may be symptomatic    Currently in Pain? Yes    Pain Score 8     Pain Location Foot    Pain Orientation Right;Left;Medial    Pain Descriptors / Indicators Sharp    Pain Type Acute pain    Pain Onset More than a month ago    Pain Frequency Intermittent    Aggravating Factors  rest     Pain Relieving Factors stretches, massage, icing                [facility] PT Assessment - 06/01/21 0001       Assessment   Medical Diagnosis bil plantar faciitis    Referring Provider (PT) Lanae Crumbly DPM    Onset Date/Surgical Date 01/25/21    Prior Therapy 07/15/21      Precautions   Precautions None    Required Braces or Orthoses Other Brace/Splint    Other Brace/Splint has splint but can only tolerate one hour.      Restrictions   Weight Bearing Restrictions No      Balance Screen   Has the patient fallen in the past 6 months No    Has the patient had a decrease in activity level because of a fear of falling?  No    Is the patient reluctant to leave their home because of a fear of falling?  No      Home Environment   Living Environment Private residence    Additional Comments stairs; difficult with house shoes  Prior Function   Level of Independence Independent    Vocation Full time employment    Vocation Requirements works from home; up and down      Observation/Other Assessments-Edema    Edema --   visible edema present in bil feet Rt>Lt     Posture/Postural Control   Posture Comments stands in right hip ER; stands in bil calcaneal eversion; pes cavus bil      ROM / Strength   AROM / PROM / Strength AROM;PROM;Strength      AROM   AROM Assessment Site Ankle    Right/Left Ankle Right;Left    Right Ankle Dorsiflexion -12    Right Ankle Plantar Flexion 70    Right Ankle Inversion 32    Right Ankle Eversion 15    Left Ankle Dorsiflexion -1    Left Ankle Plantar Flexion 60    Left Ankle Inversion 13    Left Ankle Eversion 20      PROM   PROM Assessment Site Ankle    Right/Left Ankle Right;Left    Right Ankle Dorsiflexion 0    Right Ankle Plantar Flexion 75    Right Ankle Inversion 46    Right Ankle Eversion 35    Left Ankle Dorsiflexion 5    Left Ankle Plantar Flexion 71    Left Ankle Inversion 38    Left Ankle Eversion 30      Strength    Overall Strength Comments bil ankle 5/5 except left Ever 4+/5 and right PF 4/5      Flexibility   Soft Tissue Assessment /Muscle Length yes    Hamstrings tightness at end range bil    Quadriceps marked Gastroc tightness    Piriformis WNL      Palpation   Patella mobility decreased calcaneal mobility in inversion bl    Palpation comment tender in med plantar surface bil Rt>Lt;; pain with mobs to cuboid; increased tension in bil plantar fascia; pain with palpation of right post tib                        Objective measurements completed on examination: See above findings.                PT Education - 06/01/21 1550     Education Details current HEP reviewed and modified for longer holds and temp for heel lifts, attempted arch strengthening but difficult for pt to engage; discussed shoe wear.    Person(s) Educated Patient    Methods Explanation;Demonstration;Verbal cues;Handout    Comprehension Verbalized understanding;Returned demonstration                 PT Long Term Goals - 06/01/21 1614       PT LONG TERM GOAL #1   Title improved bil ankle DF >= 5 deg    Time 6    Period Weeks    Status New    Target Date 07/13/21      PT LONG TERM GOAL #2   Title decreased pain upon standing by >=75% in bil feet    Time 6    Period Weeks    Status New      PT LONG TERM GOAL #3   Title improved active eversion to 25 deg bil and ever strength 5/5 bil.    Time 6    Period Weeks    Status New      PT LONG TERM GOAL #4   Title Patient able  to climb stairs with >= 50% less pain.    Time 6    Period Weeks    Status New                    Plan - 06/01/21 1602     Clinical Impression Statement Patient presents today with c/o of bil foot pain since August 2022 when she just awoke one day with pain. Pain is worse in the morning and also occurs after standing for one hour or sitting for any length of time. She is able to walk without  difficulty, but does report pain with stairs. She has marked tightness in bil gastrocnemius and palpable pain at medial plantar fascia at heel. She has marked limitations in bil ankle DF and weakness with PF right greater than left. She has been doing stretches for these deficits and has had injections which also helped, but her pain persists. She stands in bil calcaneal eversion. Pt advised to begin holding her stretches for 60 sec or longer and tempo was added to her heel raises which should be progressed to unilateral as tolerated. She will benefit from further skilled PT to address the above deficits and return her to her PLOF. Patient also has bil edema in her feet which has been addressed my her doctors but no cause yet found. She has had this since having chemotherapy in 1997.    Stability/Clinical Decision Making Stable/Uncomplicated    Clinical Decision Making Low    Rehab Potential Good    PT Frequency 2x / week    PT Duration 6 weeks    PT Treatment/Interventions ADLs/Self Care Home Management;Cryotherapy;Electrical Stimulation;Iontophoresis 4mg /ml Dexamethasone;Moist Heat;Neuromuscular re-education;Therapeutic exercise;Therapeutic activities;Patient/family education;Manual techniques;Dry needling;Passive range of motion;Taping;Vasopneumatic Device;Joint Manipulations    PT Next Visit Plan assess great toe extension; review new HEP. STM/MFR to plantar fascia, gastroc, post tib. possible taping, ionto    PT Home Exercise Plan [email or web address]    Consulted and Agree with Plan of Care Patient             Patient will benefit from skilled therapeutic intervention in order to improve the following deficits and impairments:  Decreased range of motion, Pain, Increased muscle spasms, Impaired flexibility, Hypomobility, Postural dysfunction, Increased edema, Decreased strength  Visit Diagnosis: Pain in right foot  Pain in left foot  Stiffness of right ankle, not elsewhere classified  Stiffness of  left ankle, not elsewhere classified  Localized edema  Cramp and spasm     Problem List Patient Active Problem List   Diagnosis Date Noted   Chronic eczematous otitis externa of both ears 01/25/2018   Dysphonia 01/25/2018   Tonsil stone 01/25/2018   Anxiety 05/17/2017   GAD (generalized anxiety disorder) 08/18/2015   Low back ache 08/18/2015   Menopausal syndrome 08/18/2015    Madelyn Flavors, PT 06/01/2021, 4:21 PM  [facility] [address]  [place], [place], [address] Phone: [phone]   Fax:  [phone]  Name: NACOLE FLUHR MRN: [id number] Date of Birth: [birth year]

## 2021-06-03 ENCOUNTER — Ambulatory Visit: Payer: 59 | Admitting: Physical Therapy

## 2021-06-07 ENCOUNTER — Ambulatory Visit: Payer: 59 | Admitting: Physical Therapy

## 2021-06-07 ENCOUNTER — Encounter: Payer: Self-pay | Admitting: Physical Therapy

## 2021-06-07 ENCOUNTER — Other Ambulatory Visit: Payer: Self-pay

## 2021-06-07 DIAGNOSIS — M79671 Pain in right foot: Secondary | ICD-10-CM

## 2021-06-07 DIAGNOSIS — M25672 Stiffness of left ankle, not elsewhere classified: Secondary | ICD-10-CM

## 2021-06-07 DIAGNOSIS — M25671 Stiffness of right ankle, not elsewhere classified: Secondary | ICD-10-CM

## 2021-06-07 DIAGNOSIS — M79672 Pain in left foot: Secondary | ICD-10-CM

## 2021-06-07 DIAGNOSIS — R600 Localized edema: Secondary | ICD-10-CM

## 2021-06-07 DIAGNOSIS — R252 Cramp and spasm: Secondary | ICD-10-CM

## 2021-06-07 NOTE — Therapy (Signed)
[facility] [address]  [place], [place], [address] Phone: [phone]   Fax:  [phone]  Physical Therapy Treatment  Patient Details  Name: Katie Bridges MRN: [id number] Date of Birth: 1966-01-16 Referring Provider (PT): Lanae Crumbly DPM   Encounter Date: 06/07/2021   PT End of Session - 06/07/21 1257     Visit Number 2    Date for PT Re-Evaluation 07/13/21    Authorization Type UHC    PT Start Time 1300    PT Stop Time 1062    PT Time Calculation (min) 44 min    Activity Tolerance Patient tolerated treatment well    Behavior During Therapy [facility] for tasks assessed/performed             Past Medical History:  Diagnosis Date   Abnormal Pap smear 1988   unsure if had any treatment   Anemia    Chronic edema    Edema of right foot    Fibroid    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   Subjective Assessment - 06/07/21 1259     Subjective Feeling a little improvement with stretches and heel raises. Feeling more pressure than pain.    Diagnostic tests xrays: small calcaneal spurs which may be symptomatic    Currently in Pain? No/denies                [facility] PT Assessment - 06/07/21 0001       Posture/Postural Control   Posture Comments correction: stands in bil calc inversion                           [facility] Adult PT Treatment/Exercise - 06/07/21 0001       Exercises   Exercises Ankle      Manual Therapy   Manual Therapy Soft tissue mobilization    Manual therapy comments TPs present bil     Soft tissue mobilization manual and IASTM to bil gastroc/soleus      Ankle Exercises: Stretches   Plantar Fascia Stretch 1 rep;30 seconds    Plantar Fascia Stretch Limitations at wall.    Soleus Stretch 1 rep;60 seconds    Soleus Stretch Limitations bil    Gastroc Stretch 1 rep;60 seconds    Gastroc Stretch Limitations bil    Other Stretch prostretch bil x 30 sec    Other Stretch great toe ext x 30 sec ea      Ankle Exercises: Standing   Heel Raises Both;10 reps;3 seconds;Right;Left    Heel Raises Limitations up/hold/down; bil then unilateral    Other Standing Ankle Exercises arch isometrics 5 sec x 10 bl    Other Standing Ankle Exercises active great toe ext x 5 ea; also tried active digits 2-4 ext with great toe flexion                     PT Education - 06/07/21 1342     Education Details HEP; DN  ED    Person(s) Educated Patient    Methods Explanation;Demonstration;Handout    Comprehension Verbalized understanding;Returned demonstration                 PT Long Term Goals - 06/01/21 1614       PT LONG TERM GOAL #1   Title improved bil ankle DF >= 5 deg    Time 6    Period Weeks    Status New    Target Date 07/13/21      PT LONG TERM GOAL #2   Title decreased pain upon standing by >=75% in bil feet    Time 6    Period Weeks    Status New      PT LONG TERM GOAL #3   Title improved active eversion to 25 deg bil and ever strength 5/5 bil.    Time 6    Period Weeks    Status New      PT LONG TERM GOAL #4   Title Patient able to climb stairs with >= 50% less pain.    Time 6    Period Weeks    Status New                   Plan - 06/07/21 1546     Clinical Impression Statement Pt reporting some improvement since IE with holding stretches longer and modified heel raises. She was able to tolerate small amount of unilateral heel raises today with right PF weaker than left. She has difficulty with active great toe extension and some  tightness. Pt would benefit from DN to bil gastroc/soleus and possibly quadratus plantae, however she is hesitant.    PT Frequency 2x / week    PT Duration 6 weeks    PT Treatment/Interventions ADLs/Self Care Home Management;Cryotherapy;Electrical Stimulation;Iontophoresis 4mg /ml Dexamethasone;Moist Heat;Neuromuscular re-education;Therapeutic exercise;Therapeutic activities;Patient/family education;Manual techniques;Dry needling;Passive range of motion;Taping;Vasopneumatic Device;Joint Manipulations    PT Next Visit Plan continue TE, STM/MFR to plantar fascia, gastroc. possible taping, ionto    PT Home Exercise Plan [email or web address]    Consulted and Agree with Plan of Care Patient             Patient will benefit from skilled therapeutic intervention in order to improve the following deficits and impairments:  Decreased range of motion, Pain, Increased muscle spasms, Impaired flexibility, Hypomobility, Postural dysfunction, Increased edema, Decreased strength  Visit Diagnosis: Pain in right foot  Pain in left foot  Stiffness of right ankle, not elsewhere classified  Stiffness of left ankle, not elsewhere classified  Localized edema  Cramp and spasm     Problem List Patient Active Problem List   Diagnosis Date Noted   Chronic eczematous otitis externa of both ears 01/25/2018   Dysphonia 01/25/2018   Tonsil stone 01/25/2018   Anxiety 05/17/2017   GAD (generalized anxiety disorder) 08/18/2015   Low back ache 08/18/2015   Menopausal syndrome 08/18/2015   Madelyn Flavors, PT 06/07/2021, 3:55 PM  [facility] [address]  [place], [place], [address] Phone: [phone]   Fax:  [phone]  Name: YALANDA SODERMAN MRN: [id number] Date of Birth: 1965-07-16

## 2021-06-07 NOTE — Patient Instructions (Addendum)
Trigger Point Dry Needling  What is Trigger Point Dry Needling (DN)? DN is a physical therapy technique used to treat muscle pain and dysfunction. Specifically, DN helps deactivate muscle trigger points (muscle knots).  A thin filiform needle is used to penetrate the skin and stimulate the underlying trigger point. The goal is for a local twitch response (LTR) to occur and for the trigger point to relax. No medication of any kind is injected during the procedure.   What Does Trigger Point Dry Needling Feel Like?  The procedure feels different for each individual patient. Some patients report that they do not actually feel the needle enter the skin and overall the process is not painful. Very mild bleeding may occur. However, many patients feel a deep cramping in the muscle in which the needle was inserted. This is the local twitch response.   How Will I feel after the treatment? Soreness is normal, and the onset of soreness may not occur for a few hours. Typically this soreness does not last longer than two days.  Bruising is uncommon, however; ice can be used to decrease any possible bruising.  In rare cases feeling tired or nauseous after the treatment is normal. In addition, your symptoms may get worse before they get better, this period will typically not last longer than 24 hours.   What Can I do After My Treatment? Increase your hydration by drinking more water for the next 24 hours. You may place ice or heat on the areas treated that have become sore, however, do not use heat on inflamed or bruised areas. Heat often brings more relief post needling. You can continue your regular activities, but vigorous activity is not recommended initially after the treatment for 24 hours. DN is best combined with other physical therapy such as strengthening, stretching, and other therapies.   Madelyn Flavors, PT 06/07/21 1:42 PM  Access Code: [email or web address] URL: [email or web address] Date:  06/07/2021 Prepared by: Almyra Free  Exercises Standing Heel Raises - 1 x daily - 7 x weekly - 2 sets - 10 reps - 3 sec hold Standing Soleus Stretch - 2 x daily - 7 x weekly - 1 sets - 3 reps - 30-60 sec hold Arch Lifting - 2 x daily - 7 x weekly - 1 sets - 10 reps - 5 sec hold Toe Yoga - Alternating Great Toe and Lesser Toe Extension - 1 x daily - 7 x weekly - 1 sets - 10 reps

## 2021-06-09 ENCOUNTER — Other Ambulatory Visit: Payer: Self-pay

## 2021-06-09 ENCOUNTER — Ambulatory Visit: Payer: 59

## 2021-06-09 DIAGNOSIS — M79672 Pain in left foot: Secondary | ICD-10-CM

## 2021-06-09 DIAGNOSIS — R252 Cramp and spasm: Secondary | ICD-10-CM

## 2021-06-09 DIAGNOSIS — M79671 Pain in right foot: Secondary | ICD-10-CM

## 2021-06-09 DIAGNOSIS — M25671 Stiffness of right ankle, not elsewhere classified: Secondary | ICD-10-CM

## 2021-06-09 DIAGNOSIS — M25672 Stiffness of left ankle, not elsewhere classified: Secondary | ICD-10-CM

## 2021-06-09 DIAGNOSIS — R600 Localized edema: Secondary | ICD-10-CM

## 2021-06-09 NOTE — Therapy (Signed)
[facility] [address]  [place], [place], [address] Phone: [phone]   Fax:  [phone]  Physical Therapy Treatment  Patient Details  Name: Katie Bridges MRN: [id number] Date of Birth: [birth year] Referring Provider (PT): Lanae Crumbly DPM   Encounter Date: 06/09/2021   PT End of Session - 06/09/21 1618     Visit Number 3    Date for PT Re-Evaluation 07/13/21    Authorization Type UHC    PT Start Time 1406    PT Stop Time 3300    PT Time Calculation (min) 42 min    Activity Tolerance Patient tolerated treatment well    Behavior During Therapy [facility] for tasks assessed/performed             Past Medical History:  Diagnosis Date   Abnormal Pap smear 1988   unsure if had any treatment   Anemia    Chronic edema    Edema of right foot    Fibroid    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   Subjective Assessment - 06/09/21 1410     Subjective Pt still having pain in both heels. Not feeling much improvement thus far.    Diagnostic tests xrays: small calcaneal spurs which may be symptomatic    Currently in Pain? Yes    Pain Score 6     Pain Location Foot    Pain Orientation Right;Left    Pain Descriptors / Indicators Sharp    Pain Type Acute pain                               [facility] Adult PT Treatment/Exercise - 06/09/21 0001       Manual Therapy   Manual Therapy Soft tissue mobilization    Soft tissue mobilization STM to B plantar fascia, retrograde massage for edema      Ankle Exercises: Seated    Towel Crunch --   10 reps toe curls   Other Seated Ankle Exercises toe extension 10x; toe yoga 10x    Other Seated Ankle Exercises eversion with towel 10x bil      Ankle Exercises: Stretches   Plantar Fascia Stretch 30 seconds    Plantar Fascia Stretch Limitations bil, seated with towel                          PT Long Term Goals - 06/09/21 1620       PT LONG TERM GOAL #1   Title improved bil ankle DF >= 5 deg    Time 6    Period Weeks    Status On-going    Target Date 07/13/21      PT LONG TERM GOAL #2   Title decreased pain upon standing by >=75% in bil feet    Time 6    Period Weeks    Status On-going      PT LONG TERM GOAL #3  Title improved active eversion to 25 deg bil and ever strength 5/5 bil.    Time 6    Period Weeks    Status On-going      PT LONG TERM GOAL #4   Title Patient able to climb stairs with >= 50% less pain.    Time 6    Period Weeks    Status On-going                   Plan - 06/09/21 1619     Clinical Impression Statement Pt presented with B tightness in her plantar fascia. She had a lot of trouble with the toe yoga. Due to her pain focused mostly on STM to increase foot flexibility and decrease pain. Afterwards she noted decreased pain, would continue to benefit from more flexibility exercises for her feet and STM to decrease stiffness.    PT Frequency 2x / week    PT Duration 6 weeks    PT Treatment/Interventions ADLs/Self Care Home Management;Cryotherapy;Electrical Stimulation;Iontophoresis 4mg /ml Dexamethasone;Moist Heat;Neuromuscular re-education;Therapeutic exercise;Therapeutic activities;Patient/family education;Manual techniques;Dry needling;Passive range of motion;Taping;Vasopneumatic Device;Joint Manipulations    PT Next Visit Plan continue TE, STM/MFR to plantar fascia, gastroc. possible taping, ionto    PT Home Exercise Plan [email or web address]    Consulted and Agree with Plan of Care Patient              Patient will benefit from skilled therapeutic intervention in order to improve the following deficits and impairments:  Decreased range of motion, Pain, Increased muscle spasms, Impaired flexibility, Hypomobility, Postural dysfunction, Increased edema, Decreased strength  Visit Diagnosis: Pain in right foot  Pain in left foot  Stiffness of right ankle, not elsewhere classified  Stiffness of left ankle, not elsewhere classified  Localized edema  Cramp and spasm     Problem List Patient Active Problem List   Diagnosis Date Noted   Chronic eczematous otitis externa of both ears 01/25/2018   Dysphonia 01/25/2018   Tonsil stone 01/25/2018   Anxiety 05/17/2017   GAD (generalized anxiety disorder) 08/18/2015   Low back ache 08/18/2015   Menopausal syndrome 08/18/2015    Artist Pais, PTA 06/09/2021, 4:21 PM  [facility] [address]  [place], [place], [address] Phone: [phone]   Fax:  [phone]  Name: Katie Bridges MRN: [id number] Date of Birth: [birth year]

## 2021-06-14 ENCOUNTER — Other Ambulatory Visit: Payer: Self-pay

## 2021-06-14 ENCOUNTER — Encounter: Payer: Self-pay | Admitting: Physical Therapy

## 2021-06-14 ENCOUNTER — Ambulatory Visit: Payer: 59 | Admitting: Physical Therapy

## 2021-06-14 DIAGNOSIS — M79671 Pain in right foot: Secondary | ICD-10-CM | POA: Diagnosis not present

## 2021-06-14 DIAGNOSIS — M25671 Stiffness of right ankle, not elsewhere classified: Secondary | ICD-10-CM

## 2021-06-14 DIAGNOSIS — R600 Localized edema: Secondary | ICD-10-CM

## 2021-06-14 DIAGNOSIS — R252 Cramp and spasm: Secondary | ICD-10-CM

## 2021-06-14 DIAGNOSIS — M25672 Stiffness of left ankle, not elsewhere classified: Secondary | ICD-10-CM

## 2021-06-14 DIAGNOSIS — M79672 Pain in left foot: Secondary | ICD-10-CM

## 2021-06-14 NOTE — Therapy (Signed)
[facility] [place] °[facility] [place] °[address]  [address] °[place], [place], [address] °Phone: [phone]   Fax:  [phone] ° °Physical Therapy Treatment ° °Patient Details  °Name: Zen B Connery °MRN: [id number] °Date of Birth: 09/15/1965 °Referring Provider (PT): Adam McDonald DPM ° ° °Encounter Date: 06/14/2021 ° ° PT End of Session - 06/14/21 1255   ° ° Visit Number 4   ° Date for PT Re-Evaluation 07/13/21   ° Authorization Type UHC   ° PT Start Time 1300   ° PT Stop Time 1340   ° PT Time Calculation (min) 40 min   ° Activity Tolerance Patient tolerated treatment well   ° Behavior During Therapy WFL for tasks assessed/performed   ° °  °  ° °  ° ° °Past Medical History:  °Diagnosis Date  ° Abnormal Pap smear 1988  ° unsure if had any treatment  ° Anemia   ° Chronic edema   ° Edema of right foot   ° Fibroid   ° 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 ° ° Subjective Assessment - 06/14/21 1301   ° ° Subjective Patient acquired new inserts from the [employer] on Friday and new sneakers as well and seeing significant improvements already.   ° Currently in Pain? Yes   upon standing  ° Pain Score 5    ° Pain Location Heel   ° Pain Orientation Right;Left   ° Pain Descriptors / Indicators --   not as sharp as it was  ° °  °  ° °  ° ° ° ° ° [facility] PT Assessment - 06/14/21 0001   ° °  ° AROM  ° AROM Assessment Site Ankle   ° Right/Left Ankle Right;Left   ° Right Ankle Dorsiflexion 6   ° Left Ankle Dorsiflexion 3   ° Left Ankle Eversion 20   °  ° PROM  ° PROM Assessment Site Ankle   ° Right/Left Ankle  Right;Left   ° Right Ankle Dorsiflexion 11   ° Left Ankle Dorsiflexion 6   ° °  °  ° °  ° ° ° ° ° ° ° ° ° ° ° ° ° ° ° ° [facility] Adult PT Treatment/Exercise - 06/14/21 0001   ° °  ° Self-Care  ° Self-Care Other Self-Care Comments   ° Other Self-Care Comments  discussed new arch supports and shoes and process of obtaining; also discussed importance of wearing these items at all times when awake.   °  ° Manual Therapy  ° Manual Therapy Soft tissue mobilization;Myofascial release;Passive ROM   ° Soft tissue mobilization IASTM and manual therapy to bil gastroc, soleus, medial arches   ° Myofascial Release IASTM and manual therapy to bil plantar fascia   ° Passive ROM passive DF stretch to bil ankles   °  ° Ankle Exercises: Seated  ° Other Seated Ankle Exercises toe ext with blocked toes x 5 bil; attempted ADD; too difficult   ° °  °  ° °  ° ° ° ° ° ° ° ° ° ° ° ° ° ° °   PT Long Term Goals - 06/14/21 1305       PT LONG TERM GOAL #1   Title improved bil ankle DF >= 5 deg    Status Partially Met      PT LONG TERM GOAL #2   Title decreased pain upon standing by >=75% in bil feet    Baseline 10-15% decrease with the insertions    Status On-going      PT LONG TERM GOAL #3   Title improved active eversion to 25 deg bil and ever strength 5/5 bil.    Status On-going                   Plan - 06/14/21 1344     Clinical Impression Statement Patient reports significant improvement in pain since last visit. She purchased new shoes and inserts which have mad a big difference and reports the stretching is helping as well. She has met her DF goal on the left and has made gains on the right as well. Overall her pain has decreased about 10-15%. She is very tight bil plantar ligament and may benefit from [place] warm up tissues more. Plan to send cert to cover this next visit.    PT Frequency 2x / week    PT Duration 6 weeks    PT Treatment/Interventions ADLs/Self Care Home Management;Cryotherapy;Electrical  Stimulation;Iontophoresis 64m/ml Dexamethasone;Moist Heat;Neuromuscular re-education;Therapeutic exercise;Therapeutic activities;Patient/family education;Manual techniques;Dry needling;Passive range of motion;Taping;Vasopneumatic Device;Joint Manipulations    PT Next Visit Plan continue TE, STM/MFR to plantar fascia, gastroc. possible [place] bil plantar surface before manual    PT Home Exercise Plan [email or web address]   Consulted and Agree with Plan of Care Patient             Patient will benefit from skilled therapeutic intervention in order to improve the following deficits and impairments:  Decreased range of motion, Pain, Increased muscle spasms, Impaired flexibility, Hypomobility, Postural dysfunction, Increased edema, Decreased strength  Visit Diagnosis: Pain in right foot  Pain in left foot  Stiffness of right ankle, not elsewhere classified  Stiffness of left ankle, not elsewhere classified  Localized edema  Cramp and spasm     Problem List Patient Active Problem List   Diagnosis Date Noted   Chronic eczematous otitis externa of both ears 01/25/2018   Dysphonia 01/25/2018   Tonsil stone 01/25/2018   Anxiety 05/17/2017   GAD (generalized anxiety disorder) 08/18/2015   Low back ache 08/18/2015   Menopausal syndrome 08/18/2015    JMadelyn Flavors PT 06/14/2021, 8:11 PM  [facility] [place] [address] [place] [address]Phone: [phone]  Fax:  [phone] Name: AKIANNA BILLETMRN: [id number] of Birth: 8November 23, 1967

## 2021-06-16 ENCOUNTER — Other Ambulatory Visit: Payer: Self-pay

## 2021-06-16 ENCOUNTER — Ambulatory Visit: Payer: 59 | Admitting: Physical Therapy

## 2021-06-16 ENCOUNTER — Encounter: Payer: Self-pay | Admitting: Physical Therapy

## 2021-06-16 DIAGNOSIS — M79671 Pain in right foot: Secondary | ICD-10-CM

## 2021-06-16 DIAGNOSIS — R252 Cramp and spasm: Secondary | ICD-10-CM

## 2021-06-16 DIAGNOSIS — R600 Localized edema: Secondary | ICD-10-CM

## 2021-06-16 DIAGNOSIS — M79672 Pain in left foot: Secondary | ICD-10-CM

## 2021-06-16 DIAGNOSIS — M25672 Stiffness of left ankle, not elsewhere classified: Secondary | ICD-10-CM

## 2021-06-16 DIAGNOSIS — M25671 Stiffness of right ankle, not elsewhere classified: Secondary | ICD-10-CM

## 2021-06-16 NOTE — Therapy (Signed)
[facility] [place] [address]  [place], [place], [address] Phone: [phone]   Fax:  [phone]  Physical Therapy Treatment  Patient Details  Name: Katie Bridges MRN: [id number] Date of Birth: 06/07/1966 Referring Provider (PT): Lanae Crumbly DPM   Encounter Date: 06/16/2021   PT End of Session - 06/16/21 1259     Visit Number 5    Date for PT Re-Evaluation 07/13/21    Authorization Type UHC    PT Start Time 1300    PT Stop Time 7517    PT Time Calculation (min) 45 min    Activity Tolerance Patient tolerated treatment well    Behavior During Therapy [facility] for tasks assessed/performed             Past Medical History:  Diagnosis Date   Abnormal Pap smear 1988   unsure if had any treatment   Anemia    Chronic edema    Edema of right foot    Fibroid    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   Subjective Assessment - 06/16/21 1301     Subjective Heel pain is the same bil.    Diagnostic tests xrays: small calcaneal spurs which may be symptomatic    Currently in Pain? Yes    Pain Score 5     Pain Location Heel    Pain Orientation Right;Left    Pain Type Acute pain                               [facility] Adult PT Treatment/Exercise - 06/16/21 0001       Modalities   Modalities Ultrasound      Ultrasound   Ultrasound Location bil plantar fascia and medial heels    Ultrasound Parameters 1.5 w/cm2 3.3 mhz cont x 6 min ea foot    Ultrasound Goals Pain      Manual Therapy   Manual Therapy Soft tissue  mobilization;Myofascial release;Passive ROM    Soft tissue mobilization to bil arches    Myofascial Release to bil plantar fascia    Passive ROM into PNB patternes      Ankle Exercises: Seated   Other Seated Ankle Exercises in long istting: PNF D1/D2 flex ext x 20 each with VCs and TCs for correct pattern; also did in sitting to eliminate hip compensation    Other Seated Ankle Exercises RTB eversion x 20 in sitting                          PT Long Term Goals - 06/14/21 1305       PT LONG TERM GOAL #1   Title improved bil ankle DF >= 5 deg    Status Partially Met      PT LONG TERM GOAL #2   Title decreased pain upon standing by >=75% in bil feet    Baseline 10-15% decrease with the insertions    Status  On-going      PT LONG TERM GOAL #3   Title improved active eversion to 25 deg bil and ever strength 5/5 bil.    Status On-going                   Plan - 06/16/21 1452     Clinical Impression Statement Patient reported increased pain the night of last visit after deep MFR. We did initial trial of [place] to bil plantar fascia and heels today prior to manual therapy and TE. Improved ST mobility today left > right and less discomfort with manual than last visit.    PT Treatment/Interventions ADLs/Self Care Home Management;Cryotherapy;Electrical Stimulation;Iontophoresis 61m/ml Dexamethasone;Moist Heat;Neuromuscular re-education;Therapeutic exercise;Therapeutic activities;Patient/family education;Manual techniques;Dry needling;Passive range of motion;Taping;Vasopneumatic Device;Joint Manipulations;Ultrasound    PT Next Visit Plan continue [place] bil plantar surface before manual, review PNF and add resistance as tolerated;  TE, STM/MFR to plantar fascia, gastroc.    PT Home Exercise Plan [email or web address]    Consulted and Agree with Plan of Care Patient             Patient will benefit from skilled therapeutic intervention in order to improve the following deficits and  impairments:  Decreased range of motion, Pain, Increased muscle spasms, Impaired flexibility, Hypomobility, Postural dysfunction, Increased edema, Decreased strength  Visit Diagnosis: Pain in right foot  Pain in left foot  Stiffness of right ankle, not elsewhere classified  Stiffness of left ankle, not elsewhere classified  Localized edema  Cramp and spasm     Problem List Patient Active Problem List   Diagnosis Date Noted   Chronic eczematous otitis externa of both ears 01/25/2018   Dysphonia 01/25/2018   Tonsil stone 01/25/2018   Anxiety 05/17/2017   GAD (generalized anxiety disorder) 08/18/2015   Low back ache 08/18/2015   Menopausal syndrome 08/18/2015   JMadelyn Flavors PT 06/16/2021, 2:57 PM  [facility] [place] [address]Phone: [phone]  Fax:  [phone] Name: ACABELLA KIMMMRN: [id number] of Birth: 802-13-1967

## 2021-06-16 NOTE — Patient Instructions (Signed)
Access Code: [email or web address] URL: [email or web address] Date: 06/16/2021 Prepared by: Almyra Free  Exercises Standing Heel Raises - 1 x daily - 7 x weekly - 2 sets - 10 reps - 3 sec hold Standing Soleus Stretch - 2 x daily - 7 x weekly - 1 sets - 3 reps - 30-60 sec hold Arch Lifting - 2 x daily - 7 x weekly - 1 sets - 10 reps - 5 sec hold Toe Yoga - Alternating Great Toe and Lesser Toe Extension - 1 x daily - 7 x weekly - 1 sets - 10 reps Long Sitting Ankle PNF D2 AROM - 1 x daily - 4 x weekly - 2 sets - 10 reps Long Sitting Ankle PNF D1 AROM - 1 x daily - 4 x weekly - 2 sets - 10 reps Seated Ankle Eversion with Resistance - 1 x daily - 4 x weekly - 3 sets - 10 reps

## 2021-06-30 ENCOUNTER — Encounter: Payer: Self-pay | Admitting: Physical Therapy

## 2021-06-30 ENCOUNTER — Ambulatory Visit: Payer: 59 | Attending: Podiatry | Admitting: Physical Therapy

## 2021-06-30 ENCOUNTER — Other Ambulatory Visit: Payer: Self-pay

## 2021-06-30 DIAGNOSIS — R252 Cramp and spasm: Secondary | ICD-10-CM | POA: Insufficient documentation

## 2021-06-30 DIAGNOSIS — M25672 Stiffness of left ankle, not elsewhere classified: Secondary | ICD-10-CM | POA: Diagnosis present

## 2021-06-30 DIAGNOSIS — M79672 Pain in left foot: Secondary | ICD-10-CM | POA: Diagnosis present

## 2021-06-30 DIAGNOSIS — R600 Localized edema: Secondary | ICD-10-CM | POA: Insufficient documentation

## 2021-06-30 DIAGNOSIS — M25671 Stiffness of right ankle, not elsewhere classified: Secondary | ICD-10-CM | POA: Diagnosis present

## 2021-06-30 DIAGNOSIS — M79671 Pain in right foot: Secondary | ICD-10-CM | POA: Insufficient documentation

## 2021-06-30 NOTE — Therapy (Signed)
[facility] °[facility] [place] °[address]  [address] °[place], [place], [address] °Phone: [phone]   Fax:  [phone] ° °Physical Therapy Treatment ° °Patient Details  °Name: Katie Bridges °MRN: [id number] °Date of Birth: 10/25/1965 °Referring Provider (PT): Adam McDonald DPM ° ° °Encounter Date: 06/30/2021 ° ° PT End of Session - 06/30/21 1406   ° ° Visit Number 6   ° Date for PT Re-Evaluation 07/13/21   ° Authorization Type UHC   ° PT Start Time 1405   ° PT Stop Time 1455   ° PT Time Calculation (min) 50 min   ° Activity Tolerance Patient tolerated treatment well   ° Behavior During Therapy WFL for tasks assessed/performed   ° °  °  ° °  ° ° °Past Medical History:  °Diagnosis Date  ° Abnormal Pap smear 1988  ° unsure if had any treatment  ° Anemia   ° Chronic edema   ° Edema of right foot   ° Fibroid   ° 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 ° ° Subjective Assessment - 06/30/21 1406   ° ° Subjective Reports pain is about the same.   ° Diagnostic tests xrays: small calcaneal spurs which may be symptomatic   ° Currently in Pain? Yes   ° Pain Score 5    ° Pain Location Heel   ° Pain Orientation Right;Left   ° °  °  ° °  ° ° ° ° ° ° ° ° ° ° ° ° ° ° ° ° ° ° ° ° [facility] Adult PT Treatment/Exercise - 06/30/21 0001   ° °  ° Modalities  ° Modalities Ultrasound   °  ° Ultrasound  ° Ultrasound Location bil plantar fascia, heels   ° Ultrasound Parameters 3.3 MHz, 1.5 w/cm2 cont x 5 min each foot   ° Ultrasound Goals Pain   °  ° Manual Therapy  ° Manual Therapy Soft tissue mobilization;Joint mobilization   °  Manual therapy comments to improve ankle ROM   ° Joint Mobilization to calcaneous bil, noted good mobility in fore foot and 1st MTT Jt   ° Soft tissue mobilization STM and IASTM with s/s tools to bil gastroc, soleus, achilles attachment, and bil plantar fascia   °  ° Ankle Exercises: Aerobic  ° Recumbent Bike L1 x 6 min   °  ° Ankle Exercises: Standing  ° BAPS Sitting;Level 2;10 reps;Limitations   ° BAPS Limitations DF/PF x 10, Inv/EV x 10, CW circles x 10, CCW x 10, minA needed, difficulty with Eversion, bilateral   ° Heel Raises Both;20 reps   ° Heel Raises Limitations eccentric heel raises up/hold/down/hold on 2" step for eccentric strengthening.   ° °  °  ° °  ° ° ° ° ° ° ° ° ° ° ° ° ° ° ° PT Long Term Goals - 06/14/21 1305   ° °  ° PT LONG TERM GOAL #1  ° Title improved bil ankle DF >= 5 deg   °   Status Partially Met      PT LONG TERM GOAL #2   Title decreased pain upon standing by >=75% in bil feet    Baseline 10-15% decrease with the insertions    Status On-going      PT LONG TERM GOAL #3   Title improved active eversion to 25 deg bil and ever strength 5/5 bil.    Status On-going                   Plan - 06/30/21 1459     Clinical Impression Statement Patient did not report increased pain after last visit, but no improvement overall.  She does have orthotics which are helping, and has been performing HEP.  Today progressed excercises adding eccentric heel raises, which she tolerated well, and added BAPS board exercises.  Noted decrease rear foot mobility and difficulty with eversion on BAPS board.  STM and [place] at end of session to improve mobility and decrease plantar fascia tightness, tolerated well and reported decreased pain.    PT Treatment/Interventions ADLs/Self Care Home Management;Cryotherapy;Electrical Stimulation;Iontophoresis 78m/ml Dexamethasone;Moist Heat;Neuromuscular re-education;Therapeutic exercise;Therapeutic activities;Patient/family education;Manual techniques;Dry  needling;Passive range of motion;Taping;Vasopneumatic Device;Joint Manipulations;Ultrasound    PT Next Visit Plan continue [place] bil plantar surface before manual, review PNF and add resistance as tolerated;  TE, STM/MFR to plantar fascia, gastroc.    PT Home Exercise Plan [email or web address]    Consulted and Agree with Plan of Care Patient             Patient will benefit from skilled therapeutic intervention in order to improve the following deficits and impairments:  Decreased range of motion, Pain, Increased muscle spasms, Impaired flexibility, Hypomobility, Postural dysfunction, Increased edema, Decreased strength  Visit Diagnosis: Pain in right foot  Pain in left foot  Stiffness of right ankle, not elsewhere classified  Stiffness of left ankle, not elsewhere classified  Localized edema  Cramp and spasm     Problem List Patient Active Problem List   Diagnosis Date Noted   Chronic eczematous otitis externa of both ears 01/25/2018   Dysphonia 01/25/2018   Tonsil stone 01/25/2018   Anxiety 05/17/2017   GAD (generalized anxiety disorder) 08/18/2015   Low back ache 08/18/2015   Menopausal syndrome 08/18/2015   ERennie Natter PT, DPT  06/30/2021, 3:07 PM  [facility] [address] [place] [address]Phone: [phone]  Fax:  [phone] Name: Katie GLAHNMRN: [id number] of Birth: [birth year]

## 2021-07-05 ENCOUNTER — Ambulatory Visit: Payer: 59

## 2021-07-05 ENCOUNTER — Other Ambulatory Visit: Payer: Self-pay

## 2021-07-05 DIAGNOSIS — M79672 Pain in left foot: Secondary | ICD-10-CM

## 2021-07-05 DIAGNOSIS — M25672 Stiffness of left ankle, not elsewhere classified: Secondary | ICD-10-CM

## 2021-07-05 DIAGNOSIS — M79671 Pain in right foot: Secondary | ICD-10-CM | POA: Diagnosis not present

## 2021-07-05 DIAGNOSIS — R600 Localized edema: Secondary | ICD-10-CM

## 2021-07-05 DIAGNOSIS — R252 Cramp and spasm: Secondary | ICD-10-CM

## 2021-07-05 DIAGNOSIS — M25671 Stiffness of right ankle, not elsewhere classified: Secondary | ICD-10-CM

## 2021-07-05 NOTE — Patient Instructions (Signed)
Access Code: [email or web address] URL: [email or web address] Date: 07/05/2021 Prepared by: Clarene Essex  Exercises Standing Heel Raises - 1 x daily - 7 x weekly - 2 sets - 10 reps - 3 sec hold Standing Soleus Stretch - 2 x daily - 7 x weekly - 1 sets - 3 reps - 30-60 sec hold Seated Ankle Eversion with Resistance - 1 x daily - 4 x weekly - 3 sets - 10 reps Eccentric Heel Lowering on Step - 1 x daily - 3 x weekly - 3 sets - 10 reps Seated Hip Adduction Squeeze with Ball - 1 x daily - 7 x weekly - 3 sets - 10 reps Supine Bridge - 1 x daily - 7 x weekly - 3 sets - 10 reps - 5 second hold Clamshell with Resistance - 1 x daily - 7 x weekly - 3 sets - 10 reps

## 2021-07-05 NOTE — Therapy (Addendum)
PHYSICAL THERAPY DISCHARGE SUMMARY (08/30/2021)  Visits from Start of Care: 7  Current functional level related to goals / functional outcomes: See treatment note below   Remaining deficits: See treatment note below   Education / Equipment: HEP  Plan: Patient is being discharged due to not returning to PT following 07/05/2021 visit.  She cancelled on 07/08/21 due to not feeling well but never called back to reschedule.  At this time she would require new order to return to PT.       Rennie Natter, PT, DPT 08/30/2021    [facility] [place] [address]  [place], [place], [address] Phone: [phone]   Fax:  [phone]  Physical Therapy Treatment  Patient Details  Name: Katie Bridges MRN: [id number] Date of Birth: 1966-04-14 Referring Provider (PT): Lanae Crumbly DPM   Encounter Date: 07/05/2021   PT End of Session - 07/05/21 1401     Visit Number 7    Date for PT Re-Evaluation 07/13/21    Authorization Type UHC    PT Start Time 6962    PT Stop Time 9528    PT Time Calculation (min) 42 min    Activity Tolerance Patient tolerated treatment well    Behavior During Therapy [facility] for tasks assessed/performed             Past Medical History:  Diagnosis Date   Abnormal Pap smear 1988   unsure if had any treatment   Anemia    Chronic edema    Edema of right foot    Fibroid    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   Subjective Assessment - 07/05/21 1319     Subjective " I cooked a big meal yesterday, which I normally don't do."  Still having sharp pain standing up.    Diagnostic tests xrays: small calcaneal spurs which may be symptomatic    Currently in Pain? Yes    Pain Score 4     Pain Location Heel    Pain Orientation Right;Left    Pain Descriptors / Indicators Sharp    Pain Type Acute pain;Chronic pain                               [facility] Adult PT Treatment/Exercise - 07/05/21 0001       Exercises   Exercises Knee/Hip;Ankle      Knee/Hip Exercises: Seated   Ball Squeeze 10 reps with 5 sec hold      Knee/Hip Exercises: Supine   Bridges Strengthening;Both;10 reps    Bridges Limitations 5 sec hold    Other Supine Knee/Hip Exercises marches with RTB 10x      Knee/Hip Exercises: Sidelying   Clams R/L with RTB 10x      Ankle Exercises: Aerobic   Recumbent Bike L2x51mn      Ankle Exercises: Seated   Other Seated Ankle Exercises toe curls with RTB 15 reps    Other Seated Ankle Exercises  RTB eversion x 10 in sitting                     PT Education - 07/05/21 1401     Education Details HEP progressed and update    Person(s) Educated Patient    Methods Explanation;Demonstration;Handout    Comprehension Verbalized understanding;Returned demonstration                 PT Long Term Goals - 07/05/21 1324       PT LONG TERM GOAL #1   Title improved bil ankle DF >= 5 deg    Status Partially Met      PT LONG TERM GOAL #2   Title decreased pain upon standing by >=75% in bil feet    Baseline 10-15% decrease with the insertions    Status Partially Met   50% decrease in overall pain with standing     PT LONG TERM GOAL #3   Title improved active eversion to 25 deg bil and ever strength 5/5 bil.    Status On-going                   Plan - 07/05/21 1402     Clinical Impression Statement Worked more on proximal strengthening today, to improve stability and decrease stress on the heels in WB positions. Cues needed to isolate toe curls from PF with  resisted TB exercise. Her R ankle showed more weakness with the intrinsic strengthenin exercises. Pt notes 50% decrease in heel pain with standing, progress is being made toward her goal.    PT Frequency 2x / week    PT Duration 6 weeks    PT Treatment/Interventions ADLs/Self Care Home Management;Cryotherapy;Electrical Stimulation;Iontophoresis 24m/ml Dexamethasone;Moist Heat;Neuromuscular re-education;Therapeutic exercise;Therapeutic activities;Patient/family education;Manual techniques;Dry needling;Passive range of motion;Taping;Vasopneumatic Device;Joint Manipulations;Ultrasound    PT Next Visit Plan continue [place] bil plantar surface before manual, review PNF and add resistance as tolerated;  TE, STM/MFR to plantar fascia, gastroc.    PT Home Exercise Plan [email or web address]    Consulted and Agree with Plan of Care Patient             Patient will benefit from skilled therapeutic intervention in order to improve the following deficits and impairments:  Decreased range of motion, Pain, Increased muscle spasms, Impaired flexibility, Hypomobility, Postural dysfunction, Increased edema, Decreased strength  Visit Diagnosis: Pain in right foot  Pain in left foot  Stiffness of right ankle, not elsewhere classified  Stiffness of left ankle, not elsewhere classified  Localized edema  Cramp and spasm     Problem List Patient Active Problem List   Diagnosis Date Noted   Chronic eczematous otitis externa of both ears 01/25/2018   Dysphonia 01/25/2018   Tonsil stone 01/25/2018   Anxiety 05/17/2017   GAD (generalized anxiety disorder) 08/18/2015   Low back ache 08/18/2015   Menopausal syndrome 08/18/2015    BArtist Pais PTA 07/05/2021, 3:20 PM  [place] [address] [place] [address]Phone: [phone]  Fax:  [phone] Name: Katie SANZOMRN: [id number] of Birth: [birth year]

## 2021-07-08 ENCOUNTER — Ambulatory Visit: Payer: 59 | Admitting: Physical Therapy

## 2021-07-15 ENCOUNTER — Ambulatory Visit (INDEPENDENT_AMBULATORY_CARE_PROVIDER_SITE_OTHER): Payer: 59 | Admitting: Podiatry

## 2021-07-15 ENCOUNTER — Other Ambulatory Visit: Payer: Self-pay

## 2021-07-15 DIAGNOSIS — M722 Plantar fascial fibromatosis: Secondary | ICD-10-CM

## 2021-07-19 ENCOUNTER — Encounter: Payer: Self-pay | Admitting: Podiatry

## 2021-07-19 NOTE — Progress Notes (Signed)
°  Subjective:  Patient ID: Katie Bridges, female    DOB: 1966/05/28,  MRN: [id number]  Chief Complaint  Patient presents with   Plantar Fasciitis    Follow up PF of bilateral heels. Pt states pain has been better but can feel that the pain is gradually trying to return.     56 y.o. female returns for follow-up with the above complaint. History confirmed with patient.  Her heel pain had started getting better but is returning and now feels like its back to where it was previously  Objective:  Physical Exam: warm, good capillary refill, no trophic changes or ulcerative lesions, normal DP and PT pulses, normal sensory exam, and bilaterally she has +1 peripheral edema and varicose veins forming. Left Foot: point tenderness over the heel pad and point tenderness of the mid plantar fascia Right Foot: point tenderness over the heel pad and point tenderness of the mid plantar fascia  No images are attached to the encounter.  Radiographs: Unable to view her radiographs of the bilateral foot but report shows: no fracture, dislocation, swelling or degenerative changes noted and plantar calcaneal spur with right greater than left Assessment:   1. Plantar fasciitis, bilateral      Plan:  Patient was evaluated and treated and all questions answered.  Suspect she has early venous insufficiency and discussed using compression stockings elevating the legs and I referred her to vascular surgery for evaluation and discuss what other options would be available  Discussed the etiology and treatment options for plantar fasciitis including stretching, formal physical therapy, supportive shoegears such as a running shoe or sneaker, pre fabricated orthoses, injection therapy, and oral medications. We also discussed the role of surgical treatment of this for patients who do not improve after exhausting non-surgical treatment options.   -Continue meloxicam physical therapy and home therapy.  She has been  doing this now for over 5 months.  No injection was performed today.  I recommend an MRI to evaluate the integrity of the plantar fascia so that we may consider other options including surgical options.  This to be ordered from [facility] and I will see her back after the MRI for review.      Return for after MRI to review.

## 2021-08-11 ENCOUNTER — Other Ambulatory Visit: Payer: 59

## 2021-08-31 ENCOUNTER — Other Ambulatory Visit: Payer: Self-pay

## 2021-08-31 ENCOUNTER — Ambulatory Visit
Admission: RE | Admit: 2021-08-31 | Discharge: 2021-08-31 | Disposition: A | Discharge status: Home / Self Care | Payer: 59 | Source: Ambulatory Visit | Attending: Podiatry | Admitting: Podiatry

## 2021-08-31 DIAGNOSIS — M722 Plantar fascial fibromatosis: Secondary | ICD-10-CM

## 2021-09-06 ENCOUNTER — Ambulatory Visit (INDEPENDENT_AMBULATORY_CARE_PROVIDER_SITE_OTHER): Payer: 59 | Admitting: Podiatry

## 2021-09-06 ENCOUNTER — Other Ambulatory Visit: Payer: Self-pay

## 2021-09-06 ENCOUNTER — Encounter: Payer: Self-pay | Admitting: Podiatry

## 2021-09-06 DIAGNOSIS — M722 Plantar fascial fibromatosis: Secondary | ICD-10-CM | POA: Diagnosis not present

## 2021-09-06 DIAGNOSIS — I872 Venous insufficiency (chronic) (peripheral): Secondary | ICD-10-CM | POA: Diagnosis not present

## 2021-09-06 NOTE — Patient Instructions (Signed)
We talked about 3 procedures today: ? ?Shockwave ultrasound for plantar fasciitis (also known as EPAT or ESWT) ? ?Topaz procedure and platelet rich plasma injection (procedure in operating room with probe that heats up) ? ?Plantar fasciotomy (cutting the plantar fascia through a small incision) ?

## 2021-09-06 NOTE — Progress Notes (Signed)
?  Subjective:  ?Patient ID: Katie Bridges, female    DOB: 13-May-1966,  MRN: [id number] ? ?Chief Complaint  ?Patient presents with  ? Plantar Fasciitis  ?   follow up to MRI results   ? ? ?56 y.o. female returns for follow-up with the above complaint. History confirmed with patient.  Heel pain feels about the same as last visit, since then she completed the MRIs ? ?Objective:  ?Physical Exam: ?warm, good capillary refill, no trophic changes or ulcerative lesions, normal DP and PT pulses, normal sensory exam, and bilaterally she has +1 peripheral edema and varicose veins forming. ?Left Foot: point tenderness over the heel pad and point tenderness of the mid plantar fascia ?Right Foot: point tenderness over the heel pad and point tenderness of the mid plantar fascia ? ?No images are attached to the encounter. ? ?Radiographs: ?Unable to view her radiographs of the bilateral foot but report shows: no fracture, dislocation, swelling or degenerative changes noted and plantar calcaneal spur with right greater than left ? ? ? ? ?Left heel MRI: ?IMPRESSION:: ?IMPRESSION: ?1. Minimal calcaneal heel spur. No plantar fasciitis. Possible trace ?marrow edema within the plantar medial aspect of the calcaneus at ?the origin of the medial band of the plantar fascia. ?2. Mild-to-moderate dorsal midfoot subcutaneous fat edema and ?swelling. ?  ?  ?Electronically Signed ?  By: Yvonne Kendall M.D. ?  On: 08/31/2021 12:24 ? ? ? ?IMPRESSION:: ?IMPRESSION: ?1. Minimal calcaneal heel spur. No plantar fasciitis. Possible trace ?marrow edema within the plantar medial aspect of the calcaneus at ?the origin of the medial band of the plantar fascia. ?2. Mild-to-moderate dorsal midfoot subcutaneous fat edema and ?swelling. ?  ?  ?Electronically Signed ?  By: Yvonne Kendall M.D. ?  On: 08/31/2021 12:24 ? ?Assessment:  ? ?1. Plantar fasciitis, bilateral   ?2. 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 Venous  (peripheral) insufficiency   ? ? ? ?Plan:  ?Patient was evaluated and treated and all questions answered. ? ?I reviewed with her again today that the swelling is likely peripheral venous insuffiencey and that elevation and compression may be helpful. I also discussed evaluation and consultation with vascular surgery and recommended a reflux study which I have ordered. ? ? ?I reviewed the results of the MRI with her and husband in detail today. We discussed further treatment with EPF, Topaz/PRP and/or EPAT. I discussed each of these procedures in detail, the risks/benefits, and likelihood of benefit. I think with the MRI findings she would benefit from a trial of either EPAT or Topaz prior to EPF, starting with the right side. She will consider these options and let me know how she would like to proceed. ? ? ? ? ?Return for when ready to schedule procedures.  ? ? ?

## 2021-09-13 ENCOUNTER — Ambulatory Visit (HOSPITAL_COMMUNITY)
Admission: RE | Admit: 2021-09-13 | Discharge: 2021-09-13 | Disposition: A | Discharge status: Home / Self Care | Payer: 59 | Source: Ambulatory Visit | Attending: Podiatry | Admitting: Podiatry

## 2021-09-13 ENCOUNTER — Other Ambulatory Visit: Payer: Self-pay

## 2021-09-13 DIAGNOSIS — I872 Venous insufficiency (chronic) (peripheral): Secondary | ICD-10-CM | POA: Diagnosis present

## 2021-10-13 ENCOUNTER — Ambulatory Visit (INDEPENDENT_AMBULATORY_CARE_PROVIDER_SITE_OTHER): Payer: 59

## 2021-10-13 DIAGNOSIS — B351 Tinea unguium: Secondary | ICD-10-CM

## 2021-10-13 DIAGNOSIS — M722 Plantar fascial fibromatosis: Secondary | ICD-10-CM

## 2021-10-13 NOTE — Progress Notes (Signed)
Patient presents for the 1st EPAT treatment today with complaint of right heel pain . Diagnosed with Plantar fasciitis by Dr. Sherryle Lis. This has been ongoing for several months. The patient has tried ice, stretching, NSAIDS and supportive shoe gear with no long term relief.  ? ?Most of the pain is located center . ? ?ESWT administered and tolerated well.Treatment settings initiated at: ? ? Energy: 15 ? ?Ended treatment session today with 3000 shocks at the following settings: ? ? Energy: 15 ? Frequency: 6.0 ? Joules: 14.72 ? ? ?Reviewed post EPAT instructions. Advised to avoid ice and NSAIDs throughout the treatment process and to utilize boot or supportive shoes for at least the next 3 days. ? ?Follow up for 2nd treatment in 1 week.  ?

## 2021-10-13 NOTE — Patient Instructions (Signed)

## 2021-10-19 ENCOUNTER — Encounter: Payer: Self-pay | Admitting: Podiatry

## 2021-11-02 ENCOUNTER — Telehealth: Payer: Self-pay | Admitting: *Deleted

## 2021-11-02 NOTE — Telephone Encounter (Signed)
Patient is calling because she tried to schedule her 2nd e-pat visit but was told that there were no more availability. What is the next step?Please advise. ?

## 2021-11-03 ENCOUNTER — Ambulatory Visit (INDEPENDENT_AMBULATORY_CARE_PROVIDER_SITE_OTHER): Payer: 59

## 2021-11-03 DIAGNOSIS — B351 Tinea unguium: Secondary | ICD-10-CM

## 2021-11-03 DIAGNOSIS — M722 Plantar fascial fibromatosis: Secondary | ICD-10-CM

## 2021-11-03 NOTE — Progress Notes (Signed)
Patient presents for the 2nd EPAT treatment today with complaint of right heel pain . Diagnosed with Plantar fasciitis by Dr. Sherryle Lis. This has been ongoing for several months. The patient has tried ice, stretching, NSAIDS and supportive shoe gear with no long term relief.  ? ?Most of the pain is located center . ? ?ESWT administered and tolerated well.Treatment settings initiated at: ? ? Energy: 20 ? ?Ended treatment session today with 3000 shocks at the following settings: ? ? Energy: 20 ? Frequency: 4.0 ? Joules: 19.62 ? ? ?Reviewed post EPAT instructions. Advised to avoid ice and NSAIDs throughout the treatment process and to utilize boot or supportive shoes for at least the next 3 days. ? ?Follow up for 3rd treatment in 1 week.  ?

## 2021-11-09 ENCOUNTER — Ambulatory Visit (INDEPENDENT_AMBULATORY_CARE_PROVIDER_SITE_OTHER): Payer: 59

## 2021-11-09 DIAGNOSIS — M722 Plantar fascial fibromatosis: Secondary | ICD-10-CM

## 2021-11-09 DIAGNOSIS — B351 Tinea unguium: Secondary | ICD-10-CM

## 2021-11-09 NOTE — Progress Notes (Signed)
Patient presents for the 3rd EPAT treatment today with complaint of right heel pain . Diagnosed with Plantar fasciitis by Dr. Sherryle Lis. This has been ongoing for several months. The patient has tried ice, stretching, NSAIDS and supportive shoe gear with no long term relief.  ? ?Most of the pain is located center . ? ?ESWT administered and tolerated well.Treatment settings initiated at: ? ? Energy: 25 ? ?Ended treatment session today with 3000 shocks at the following settings: ? ? Energy: 25 ? Frequency: 4.0 ? Joules: 24.25 ? ? ?Reviewed post EPAT instructions. Advised to avoid ice and NSAIDs throughout the treatment process and to utilize boot or supportive shoes for at least the next 3 days. ? ?Follow up for 4th treatment in 2 week.  ?

## 2021-11-10 ENCOUNTER — Ambulatory Visit (INDEPENDENT_AMBULATORY_CARE_PROVIDER_SITE_OTHER): Payer: 59 | Admitting: Vascular Surgery

## 2021-11-10 ENCOUNTER — Encounter: Payer: Self-pay | Admitting: Vascular Surgery

## 2021-11-10 VITALS — BP 116/72 | HR 83 | Temp 98.4°F | Resp 18 | Ht 65.0 in | Wt 152.8 lb

## 2021-11-10 DIAGNOSIS — I872 Venous insufficiency (chronic) (peripheral): Secondary | ICD-10-CM | POA: Diagnosis not present

## 2021-11-10 DIAGNOSIS — M7989 Other specified soft tissue disorders: Secondary | ICD-10-CM

## 2021-11-10 NOTE — Progress Notes (Signed)
? ?ASSESSMENT & PLAN  ? ?CHRONIC VENOUS INSUFFICIENCY: This patient has superficial and deep venous reflux on the right.  She has noted venous insufficiency on the left side.  Her swelling is more significant on the right.  We have discussed the importance of daily leg elevation and the proper positioning for this.  In addition we will get her fitted for some knee-high compression stockings with a gradient of 15 to 20 mmHg.  I have encouraged her to avoid prolonged sitting and standing.  We have discussed the importance of exercise.  If her swelling or symptoms progress in the future then certainly we can reevaluate her reflux study.  Currently she is not a candidate for laser ablation as there is no reflux in the proximal right great saphenous vein only from the mid thigh to the knee.  She will call if her swelling or symptoms progress. ? ?REASON FOR CONSULT:   ? ?Bilateral leg swelling right worse than left.  The consult is requested by Dr. Lanae Crumbly. ? ?HPI:  ? ?Katie Bridges is a 56 y.o. female who presents with a long history of bilateral lower extremity swelling.  She has had this for many years.  Her swelling is more significant on the right side.  She does not have any significant aching pain or heaviness associated with her swelling.  She does have a job which requires her to stand for 7-hour shifts.  This is certainly contributing to her swelling.  She has no previous history of DVT.  She does not wear compression stockings.  She does elevate her legs.  She is had no previous venous procedures. ? ?She denies any previous abdominal or inguinal surgery or radiation therapy which might be a reason to have lymphedema. ? ?Past Medical History:  ?Diagnosis Date  ? Abnormal Pap smear 1988  ? unsure if had any treatment  ? Anemia   ? Chronic edema   ? Edema of right foot   ? Fibroid   ? 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 Hyperlipidemia 2016  ? ? ?History reviewed.  No pertinent family history. ? ?SOCIAL HISTORY: ?Social History  ? ?Tobacco Use  ? Smoking status: Never  ? Smokeless tobacco: Never  ?Substance Use Topics  ? Alcohol use: No  ?  Alcohol/week: 0.0 standard drinks  ? ? ?Allergies  ?Allergen Reactions  ? Latex Hives  ? Macrobid [Nitrofurantoin Macrocrystal] Nausea And Vomiting  ?  headache  ? ? ?Current Outpatient Medications  ?Medication Sig Dispense Refill  ? Multiple Vitamins-Iron (MULTIVITAMIN/IRON PO) Take by mouth daily.    ? NONFORMULARY OR COMPOUNDED ITEM Vaginal estradiol cream, 0.02%.  Place 1/2 gram per vagina at bedtime twice weekly.  Dispense 8 gram tube, RF 11. (Patient not taking: Reported on 06/01/2021) 8 each 11  ? PARoxetine (PAXIL) 10 MG tablet Take 1 tablet (10 mg total) by mouth every morning. (Patient not taking: Reported on 11/10/2021) 30 tablet 1  ? rosuvastatin (CRESTOR) 5 MG tablet Take 5 mg by mouth daily. (Patient not taking: Reported on 11/10/2021)    ? ?No current facility-administered medications for this visit.  ? ? ?REVIEW OF SYSTEMS:  ?'[X]'$  denotes positive finding, '[ ]'$  denotes negative finding ?Cardiac  Comments:  ?Chest pain or chest pressure:    ?Shortness of breath upon exertion:    ?Short of breath when lying flat:    ?Irregular heart rhythm:    ?    ?Vascular    ?  Pain in calf, thigh, or hip brought on by ambulation:    ?Pain in feet at night that wakes you up from your sleep:     ?Blood clot in your veins:    ?Leg swelling:  x   ?    ?Pulmonary    ?Oxygen at home:    ?Productive cough:     ?Wheezing:     ?    ?Neurologic    ?Sudden weakness in arms or legs:     ?Sudden numbness in arms or legs:     ?Sudden onset of difficulty speaking or slurred speech:    ?Temporary loss of vision in one eye:     ?Problems with dizziness:     ?    ?Gastrointestinal    ?Blood in stool:     ?Vomited blood:     ?    ?Genitourinary    ?Burning when urinating:     ?Blood in urine:    ?    ?Psychiatric    ?Major depression:     ?    ?Hematologic     ?Bleeding problems:    ?Problems with blood clotting too easily:    ?    ?Skin    ?Rashes or ulcers:    ?    ?Constitutional    ?Fever or chills:    ?- ? ?PHYSICAL EXAM:  ? ?Vitals:  ? 11/10/21 1316  ?BP: 116/72  ?Pulse: 83  ?Resp: 18  ?Temp: 98.4 ?F (36.9 ?C)  ?TempSrc: Temporal  ?SpO2: 99%  ?Weight: 152 lb 12.8 oz (69.3 kg)  ?Height: '5\' 5"'$  (1.651 m)  ? ?Body mass index is 25.43 kg/m?. ?GENERAL: The patient is a well-nourished female, in no acute distress. The vital signs are documented above. ?CARDIAC: There is a regular rate and rhythm.  ?VASCULAR: I do not detect carotid bruits. ?I had a difficult time palpating pedal pulses however she had biphasic dorsalis pedis and posterior tibial signals bilaterally. ?She has mild bilateral lower extremity swelling which is more significant on the right side. ?PULMONARY: There is good air exchange bilaterally without wheezing or rales. ?ABDOMEN: Soft and non-tender with normal pitched bowel sounds.  ?MUSCULOSKELETAL: There are no major deformities. ?NEUROLOGIC: No focal weakness or paresthesias are detected. ?SKIN: There are no ulcers or rashes noted. ?PSYCHIATRIC: The patient has a normal affect. ? ?DATA:   ? ?VENOUS DUPLEX: I have reviewed the venous duplex scan that was done on 09/13/2021. ? ?On the right side, there was no evidence of DVT.  There was deep venous reflux involving the common femoral vein and femoral vein.  There was superficial venous reflux in the right great saphenous vein from the mid thigh to the knee.  The diameters of the vein ranged from 4 to 5 mm. ? ? ? ?On the left side there was no evidence of DVT.  There was no deep venous reflux.  There was no superficial venous reflux. ? ? ?Deitra Mayo ?[facility] ?

## 2021-11-23 ENCOUNTER — Other Ambulatory Visit: Payer: 59

## 2021-12-07 ENCOUNTER — Telehealth: Payer: Self-pay | Admitting: Podiatry

## 2021-12-07 NOTE — Telephone Encounter (Signed)
Left message for pt that we got the epat back up and to call to reschedule her appt.

## 2021-12-15 ENCOUNTER — Telehealth: Payer: Self-pay | Admitting: Podiatry

## 2021-12-15 NOTE — Telephone Encounter (Signed)
Lvm for pt to call to r/s epat that was canceled.Marland Kitchen

## 2022-03-21 NOTE — Progress Notes (Unsigned)
56 y.o. [id number] Married [place] American female here for annual exam.    PCP:     Patient's last menstrual period was 05/27/2008.           Sexually active: {yes no:314532}  The current method of family planning is Hysterectomy  Exercising: {yes no:314532}  {types:19826} Smoker:  no  Health Maintenance: Pap:  2009 normal History of abnormal Pap:  no MMG: *** 05-14-20 Neg/BiRads1 Colonoscopy:  *** BMD:   n/a  Result  n/a TDaP:  PCP Gardasil:   no HIV: Neg w/Ins.Physical Hep C:no Screening Labs:  Hb today: ***, Urine today: ***   reports that she has never smoked. She has never used smokeless tobacco. She reports that she does not drink alcohol and does not use drugs.  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     NONFORMULARY OR COMPOUNDED ITEM Vaginal estradiol cream, 0.02%.  Place 1/2 gram per vagina at bedtime twice weekly.  Dispense 8 gram tube, RF 11. (Patient not taking: Reported on 06/01/2021) 8 each 11   PARoxetine (PAXIL) 10 MG tablet Take 1 tablet (10 mg total) by mouth every morning. (Patient not taking: Reported on 11/10/2021) 30 tablet 1   rosuvastatin (CRESTOR) 5 MG tablet Take 5 mg by mouth daily. (Patient not taking: Reported on 11/10/2021)     No current facility-administered medications for this visit.    No family history on file.  Review  of Systems  Exam:   LMP 05/27/2008     General appearance: alert, cooperative and appears stated age Head: normocephalic, without obvious abnormality, atraumatic Neck: no adenopathy, supple, symmetrical, trachea midline and thyroid normal to inspection and palpation Lungs: clear to auscultation bilaterally Breasts: normal appearance, no masses or tenderness, No nipple retraction or dimpling, No nipple discharge or bleeding, No axillary adenopathy Heart: regular rate and rhythm Abdomen: soft, non-tender; no masses, no organomegaly Extremities: extremities normal, atraumatic, no cyanosis or edema Skin: skin color, texture, turgor normal. No rashes or lesions Lymph nodes: cervical, supraclavicular, and axillary nodes normal. Neurologic: grossly normal  Pelvic: External genitalia:  no lesions              No abnormal inguinal nodes palpated.              Urethra:  normal appearing urethra with no masses, tenderness or lesions              Bartholins and Skenes: normal                 Vagina: normal appearing vagina with normal color and discharge, no lesions              Cervix: no lesions  Pap taken: {yes no:314532} Bimanual Exam:  Uterus:  normal size, contour, position, consistency, mobility, non-tender              Adnexa: no mass, fullness, tenderness              Rectal exam: {yes no:314532}.  Confirms.              Anus:  normal sphincter tone, no lesions  Chaperone was present for exam:  ***  Assessment:   Well woman visit with gynecologic exam.   Plan: Mammogram screening discussed. Self breast awareness reviewed. Pap and HR HPV as above. Guidelines for Calcium, Vitamin D, regular exercise program including cardiovascular and weight bearing exercise.   Follow up annually and prn.   Additional counseling given.  {yes [id number]. _______ minutes face to face time of which over 50% was spent in counseling.    After visit summary provided.

## 2022-03-22 ENCOUNTER — Ambulatory Visit (INDEPENDENT_AMBULATORY_CARE_PROVIDER_SITE_OTHER): Payer: 59 | Admitting: Obstetrics and Gynecology

## 2022-03-22 ENCOUNTER — Other Ambulatory Visit (HOSPITAL_COMMUNITY)
Admission: RE | Admit: 2022-03-22 | Discharge: 2022-03-22 | Disposition: A | Discharge status: Home / Self Care | Payer: 59 | Source: Ambulatory Visit | Attending: Obstetrics and Gynecology | Admitting: Obstetrics and Gynecology

## 2022-03-22 ENCOUNTER — Encounter: Payer: Self-pay | Admitting: Obstetrics and Gynecology

## 2022-03-22 VITALS — BP 122/76 | HR 85 | Ht 64.5 in | Wt 157.0 lb

## 2022-03-22 DIAGNOSIS — Z01419 Encounter for gynecological examination (general) (routine) without abnormal findings: Secondary | ICD-10-CM

## 2022-03-22 DIAGNOSIS — Z1211 Encounter for screening for malignant neoplasm of colon: Secondary | ICD-10-CM

## 2022-03-22 DIAGNOSIS — N898 Other specified noninflammatory disorders of vagina: Secondary | ICD-10-CM | POA: Diagnosis present

## 2022-03-22 NOTE — Patient Instructions (Signed)
EXERCISE AND DIET:  We recommended that you start or continue a regular exercise program for good health. Regular exercise means any activity that makes your heart beat faster and makes you sweat.  We recommend exercising at least 30 minutes per day at least 3 days a week, preferably 4 or 5.  We also recommend a diet low in fat and sugar.  Inactivity, poor dietary choices and obesity can cause diabetes, heart attack, stroke, and kidney damage, among others.    ALCOHOL AND SMOKING:  Women should limit their alcohol intake to no more than 7 drinks/beers/glasses of wine (combined, not each!) per week. Moderation of alcohol intake to this level decreases your risk of breast cancer and liver damage. And of course, no recreational drugs are part of a healthy lifestyle.  And absolutely no smoking or even second hand smoke. Most people know smoking can cause heart and lung diseases, but did you know it also contributes to weakening of your bones? Aging of your skin?  Yellowing of your teeth and nails?  CALCIUM AND VITAMIN D:  Adequate intake of calcium and Vitamin D are recommended.  The recommendations for exact amounts of these supplements seem to change often, but generally speaking 600 mg of calcium (either carbonate or citrate) and 800 units of Vitamin D per day seems prudent. Certain women may benefit from higher intake of Vitamin D.  If you are among these women, your doctor will have told you during your visit.    PAP SMEARS:  Pap smears, to check for cervical cancer or precancers,  have traditionally been done yearly, although recent scientific advances have shown that most women can have pap smears less often.  However, every woman still should have a physical exam from her gynecologist every year. It will include a breast check, inspection of the vulva and vagina to check for abnormal growths or skin changes, a visual exam of the cervix, and then an exam to evaluate the size and shape of the uterus and  ovaries.  And after 57 years of age, a rectal exam is indicated to check for rectal cancers. We will also provide age appropriate advice regarding health maintenance, like when you should have certain vaccines, screening for sexually transmitted diseases, bone density testing, colonoscopy, mammograms, etc.   MAMMOGRAMS:  All women over 40 years old should have a yearly mammogram. Many facilities now offer a "3D" mammogram, which may cost around $50 extra out of pocket. If possible,  we recommend you accept the option to have the 3D mammogram performed.  It both reduces the number of women who will be called back for extra views which then turn out to be normal, and it is better than the routine mammogram at detecting truly abnormal areas.    COLONOSCOPY:  Colonoscopy to screen for colon cancer is recommended for all women at age 50.  We know, you hate the idea of the prep.  We agree, BUT, having colon cancer and not knowing it is worse!!  Colon cancer so often starts as a polyp that can be seen and removed at colonscopy, which can quite literally save your life!  And if your first colonoscopy is normal and you have no family history of colon cancer, most women don't have to have it again for 10 years.  Once every ten years, you can do something that may end up saving your life, right?  We will be happy to help you get it scheduled when you are ready.    Be sure to check your insurance coverage so you understand how much it will cost.  It may be covered as a preventative service at no cost, but you should check your particular policy.    Calcium Content in Foods Calcium is the most abundant mineral in the body. Most of the body's calcium supply is stored in bones and teeth. Calcium helps many parts of the body function normally, including: Blood and blood vessels. Nerves. Hormones. Muscles. Bones and teeth. When your calcium stores are low, you may be at risk for low bone mass, bone loss, and broken bones  (fractures). When you get enough calcium, it helps to support strong bones and teeth throughout your life. Calcium is especially important for: Children during growth spurts. Girls during adolescence. Women who are pregnant or breastfeeding. Women after their menstrual cycle stops (postmenopause). Women whose menstrual cycle has stopped due to anorexia nervosa or regular intense exercise. People who cannot eat or digest dairy products. Vegans. Recommended daily amounts of calcium: Women (ages 19 to 50): 1,000 mg per day. Women (ages 51 and older): 1,200 mg per day. Men (ages 19 to 70): 1,000 mg per day. Men (ages 71 and older): 1,200 mg per day. Women (ages 9 to 18): 1,300 mg per day. Men (ages 9 to 18): 1,300 mg per day. General information Eat foods that are high in calcium. Try to get most of your calcium from food. Some people may benefit from taking calcium supplements. Check with your health care provider or diet and nutrition specialist (dietitian) before starting any calcium supplements. Calcium supplements may interact with certain medicines. Too much calcium may cause other health problems, such as constipation and kidney stones. For the body to absorb calcium, it needs vitamin D. Sources of vitamin D include: Skin exposure to direct sunlight. Foods, such as egg yolks, liver, mushrooms, saltwater fish, and fortified milk. Vitamin D supplements. Check with your health care provider or dietitian before starting any vitamin D supplements. What foods are high in calcium?  Foods that are high in calcium contain more than 100 milligrams per serving. Fruits Fortified orange juice or other fruit juice, 300 mg per 8 oz serving. Vegetables Collard greens, 360 mg per 8 oz serving. Kale, 100 mg per 8 oz serving. Bok choy, 160 mg per 8 oz serving. Grains Fortified ready-to-eat cereals, 100 to 1,000 mg per 8 oz serving. Fortified frozen waffles, 200 mg in 2 waffles. Oatmeal, 140 mg in  1 cup. Meats and other proteins Sardines, canned with bones, 325 mg per 3 oz serving. Salmon, canned with bones, 180 mg per 3 oz serving. Canned shrimp, 125 mg per 3 oz serving. Baked beans, 160 mg per 4 oz serving. Tofu, firm, made with calcium sulfate, 253 mg per 4 oz serving. Dairy Yogurt, plain, low-fat, 310 mg per 6 oz serving. Nonfat milk, 300 mg per 8 oz serving. American cheese, 195 mg per 1 oz serving. Cheddar cheese, 205 mg per 1 oz serving. Cottage cheese 2%, 105 mg per 4 oz serving. Fortified soy, rice, or almond milk, 300 mg per 8 oz serving. Mozzarella, part skim, 210 mg per 1 oz serving. The items listed above may not be a complete list of foods high in calcium. Actual amounts of calcium may be different depending on processing. Contact a dietitian for more information. What foods are lower in calcium? Foods that are lower in calcium contain 50 mg or less per serving. Fruits Apple, about 6 mg. Banana, about 12 mg.   Vegetables Lettuce, 19 mg per 2 oz serving. Tomato, about 11 mg. Grains Rice, 4 mg per 6 oz serving. Boiled potatoes, 14 mg per 8 oz serving. White bread, 6 mg per slice. Meats and other proteins Egg, 27 mg per 2 oz serving. Red meat, 7 mg per 4 oz serving. Chicken, 17 mg per 4 oz serving. Fish, cod, or trout, 20 mg per 4 oz serving. Dairy Cream cheese, regular, 14 mg per 1 Tbsp serving. Brie cheese, 50 mg per 1 oz serving. Parmesan cheese, 70 mg per 1 Tbsp serving. The items listed above may not be a complete list of foods lower in calcium. Actual amounts of calcium may be different depending on processing. Contact a dietitian for more information. Summary Calcium is an important mineral in the body because it affects many functions. Getting enough calcium helps support strong bones and teeth throughout your life. Try to get most of your calcium from food. Calcium supplements may interact with certain medicines. Check with your health care provider  or dietitian before starting any calcium supplements. This information is not intended to replace advice given to you by your health care provider. Make sure you discuss any questions you have with your health care provider. Document Revised: 10/09/2019 Document Reviewed: 10/09/2019 [employer] Patient Education  2023 [employer]  

## 2022-03-23 LAB — CERVICOVAGINAL ANCILLARY ONLY
Bacterial Vaginitis (gardnerella): NEGATIVE
Candida Glabrata: NEGATIVE
Candida Vaginitis: NEGATIVE
Comment: NEGATIVE
Comment: NEGATIVE
Comment: NEGATIVE
Comment: NEGATIVE
Trichomonas: NEGATIVE

## 2022-03-30 ENCOUNTER — Encounter: Payer: Self-pay | Admitting: Obstetrics and Gynecology

## 2022-04-01 ENCOUNTER — Other Ambulatory Visit: Payer: Self-pay | Admitting: Obstetrics and Gynecology

## 2022-04-01 MED ORDER — ESTRADIOL 0.1 MG/GM VA CREA: TOPICAL_CREAM | VAGINAL | 2 refills | Status: AC

## 2022-04-13 LAB — COLOGUARD: COLOGUARD: NEGATIVE

## 2022-08-15 IMAGING — MR MR HEEL *L* W/O CM
5 series · 40 of 40 positions shown · non-contrast
Comparison: Bilateral calcanei radiographs 01/19/2021

CLINICAL DATA: Chronic bilateral plantar fasciitis for 5 months.
Bilateral heel pain and swelling.

EXAM:
MR OF THE LEFT HEEL WITHOUT CONTRAST
TECHNIQUE: Multiplanar, multisequence MR imaging of the left ankle was
performed. No intravenous contrast was administered.

[Series 4: T2 fat-sat · axial · 3.0mm · 0.62mm/px · z∈[-32,+85]mm · 8 of 32 slices shown (1 of 2)]
[im 1/32]
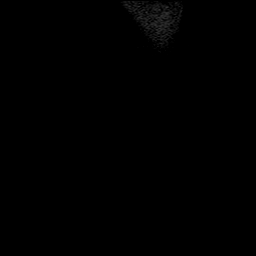
[im 5/32]
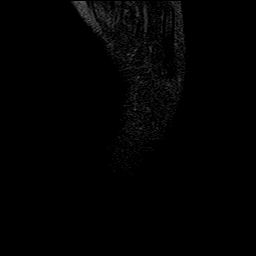
[im 9/32]
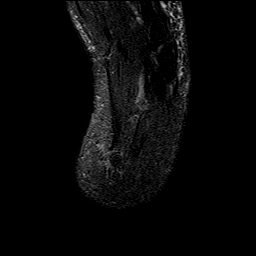
[im 14/32]
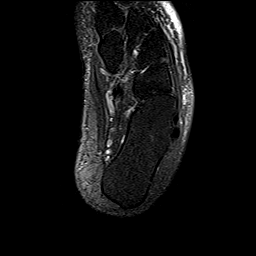
[im 18/32]
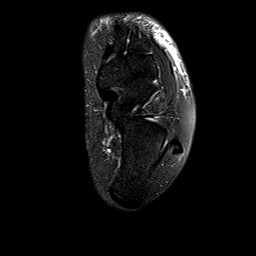
[im 23/32]
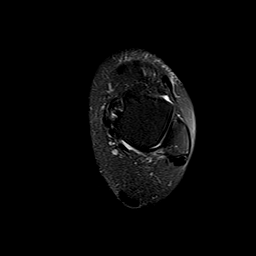
[im 27/32]
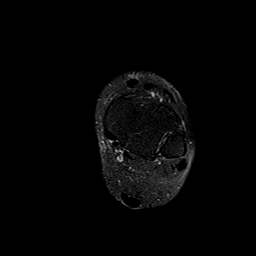
[im 32/32]
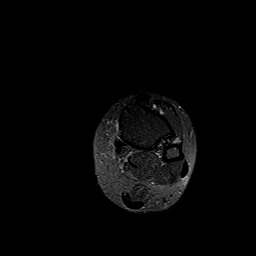

[Series 5: PD fat-sat · axial · 3.0mm · 0.62mm/px · z∈[-32,+85]mm · 8 of 32 slices shown]
[im 1/32]
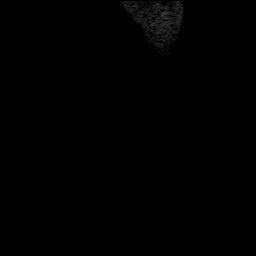
[im 5/32]
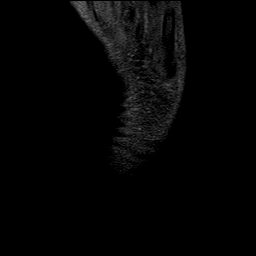
[im 9/32]
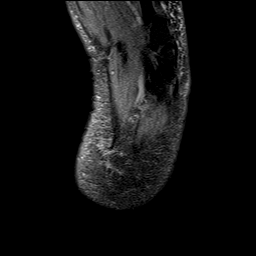
[im 14/32]
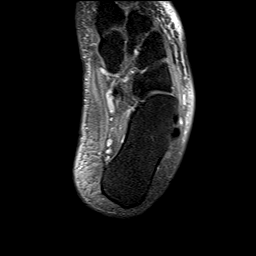
[im 18/32]
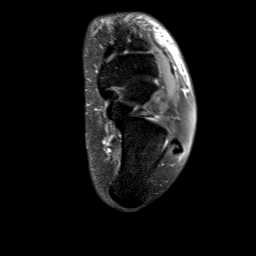
[im 23/32]
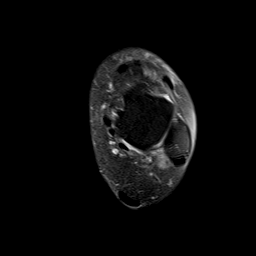
[im 27/32]
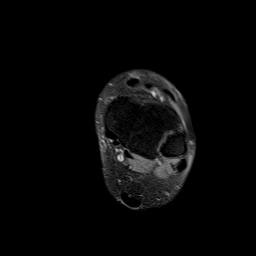
[im 32/32]
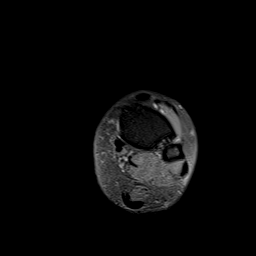

[Series 6: T1 · sagittal · 4.0mm · 0.70mm/px · 7 of 25 slices shown]
[im 1/25]
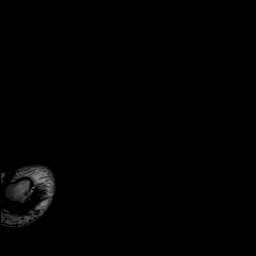
[im 5/25]
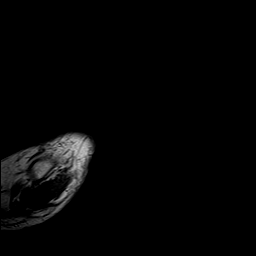
[im 9/25]
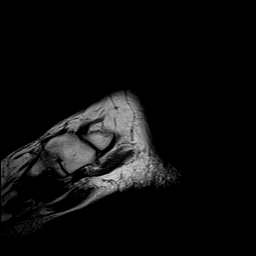
[im 13/25]
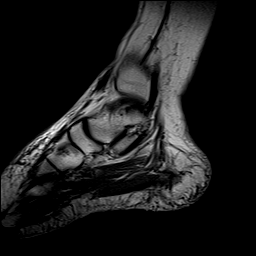
[im 17/25]
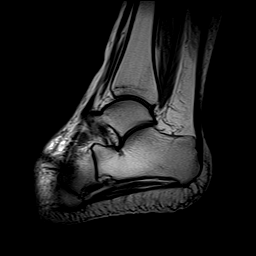
[im 21/25]
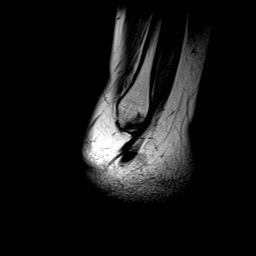
[im 25/25]
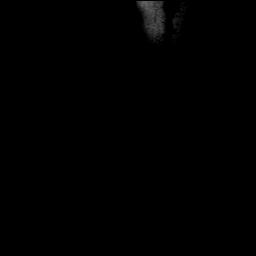

[Series 7: STIR · sagittal · 4.0mm · 0.35mm/px · 7 of 25 slices shown]
[im 1/25]
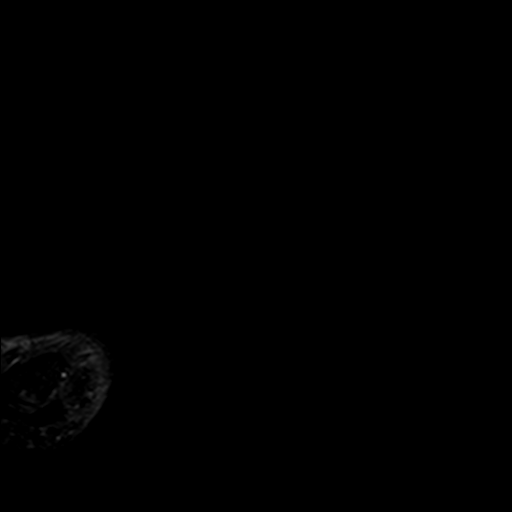
[im 5/25]
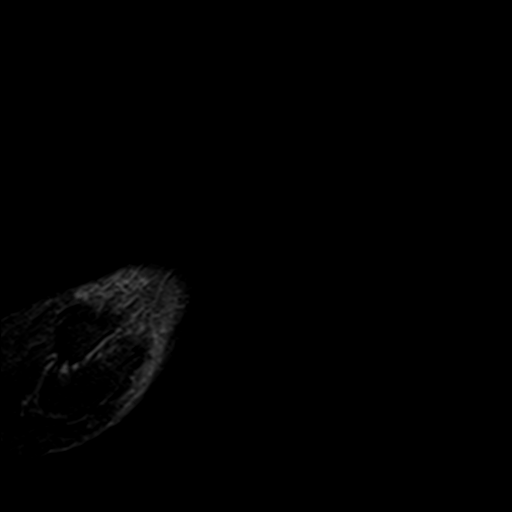
[im 9/25]
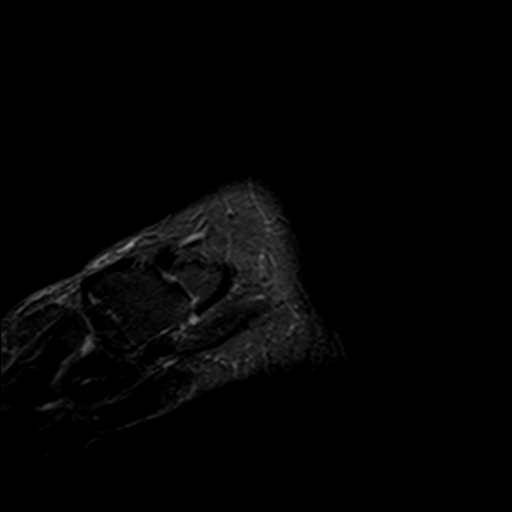
[im 13/25]
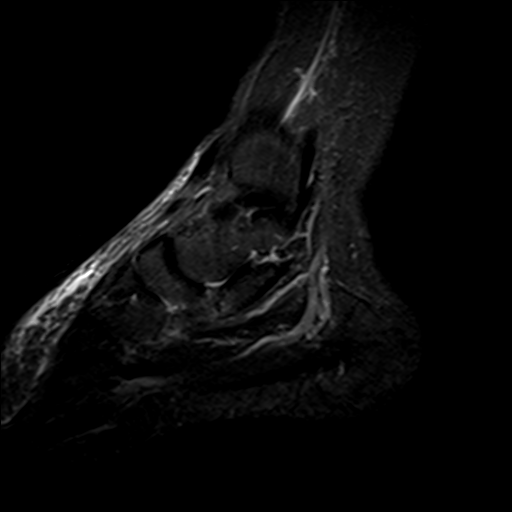
[im 17/25]
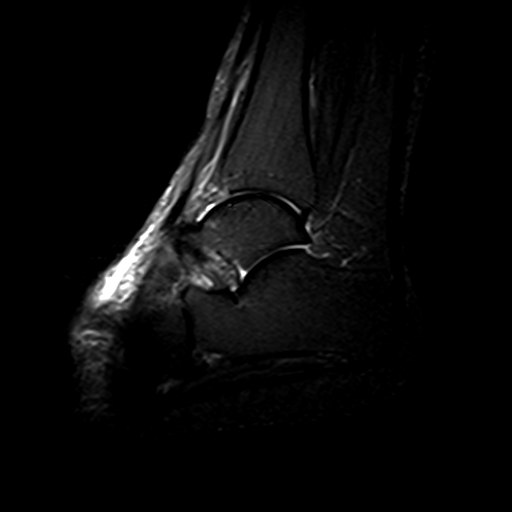
[im 21/25]
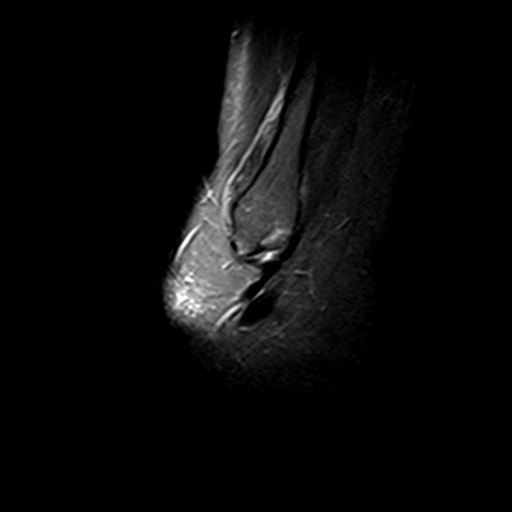
[im 25/25]
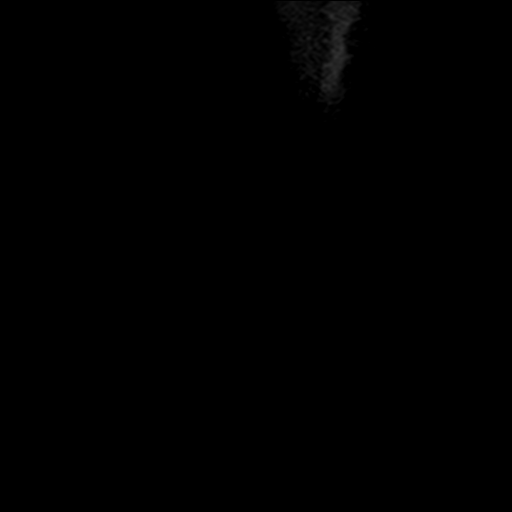

[Series 8: T2 fat-sat · coronal · 3.0mm · 0.62mm/px · 10 of 37 slices shown (2 of 2)]
[im 1/37]
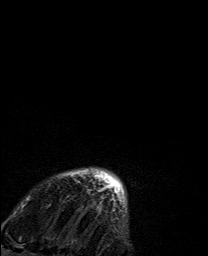
[im 5/37]
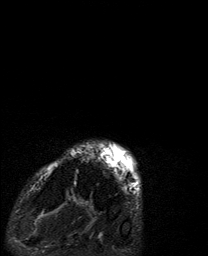
[im 9/37]
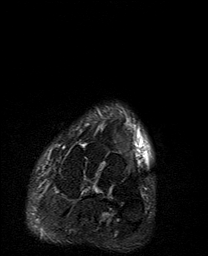
[im 13/37]
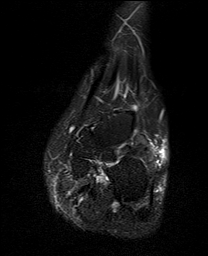
[im 17/37]
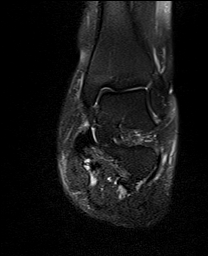
[im 21/37]
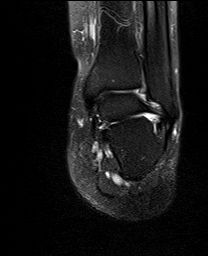
[im 25/37]
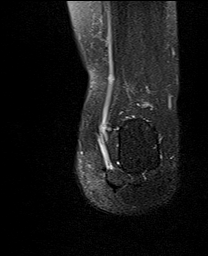
[im 29/37]
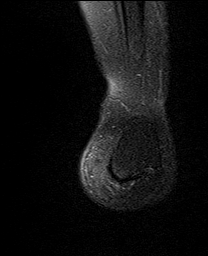
[im 33/37]
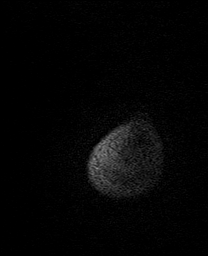
[im 37/37]
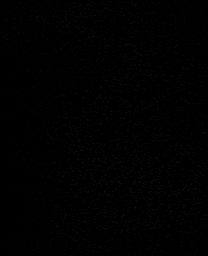

[40 of 40 positions shown; findings below may reference images not displayed]

FINDINGS: TENDONS

Peroneal: The peroneus longus and brevis tendons are intact.

Posteromedial: Intact tibialis posterior, flexor digitorum longus,
and flexor hallucis longus tendons.

Anterior: The tibialis anterior, extensor hallucis longus and
extensor digitorum longus tendons are intact.

Achilles: Intact.

Plantar Fascia: Minimal calcaneal heel spur. No abnormal thickening
or tendinosis/inflammation of the plantar fascia origin. Possible
trace marrow edema within the plantar medial aspect of the calcaneus
at the origin of the medial band of the plantar fascia (coronal
series 8, image 9).

LIGAMENTS

Lateral: The anterior and posterior talofibular, anterior and
posterior tibiofibular, and calcaneofibular ligaments are intact.

Medial: Mild intermediate T2 signal tibiotalar ligament sprain. The
tibial spring ligament appears intact.

CARTILAGE

Ankle Joint: Intact cartilage.

Subtalar Joints/Sinus Tarsi: Fat is preserved within sinus tarsi.

Bones: No acute fracture.

Other: Mild to moderate dorsal midfoot subcutaneous fat edema and
swelling.
IMPRESSION: :
IMPRESSION: 1. Minimal calcaneal heel spur. No plantar fasciitis. Possible trace
marrow edema within the plantar medial aspect of the calcaneus at
the origin of the medial band of the plantar fascia.
2. Mild-to-moderate dorsal midfoot subcutaneous fat edema and
swelling.

## 2022-08-15 IMAGING — MR MR HEEL *R* W/O CM
5 series · 40 of 40 positions shown · non-contrast
Comparison: bilateral calcaneal radiographs 01/19/2021

CLINICAL DATA: Chronic bilateral plantar fasciitis for 5 months.
Bilateral heel pain and swelling.

EXAM:
MR OF THE RIGHT HEEL WITHOUT CONTRAST
TECHNIQUE: Multiplanar, multisequence MR imaging of the right ankle was
performed. No intravenous contrast was administered.

[Series 4: T2 fat-sat · axial · 3.0mm · 0.62mm/px · z∈[-10,+132]mm · 10 of 38 slices shown (1 of 2)]
[im 1/38]
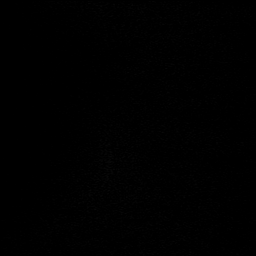
[im 5/38]
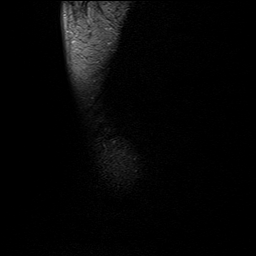
[im 9/38]
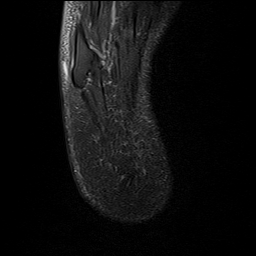
[im 13/38]
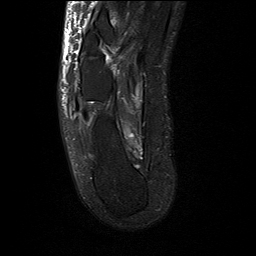
[im 17/38]
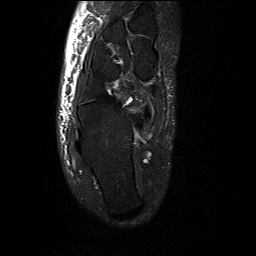
[im 21/38]
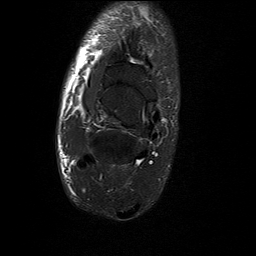
[im 25/38]
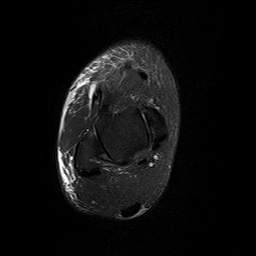
[im 29/38]
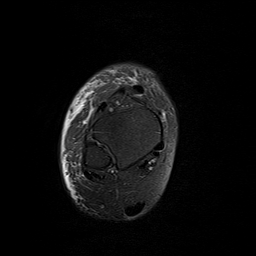
[im 33/38]
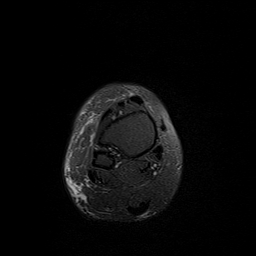
[im 38/38]
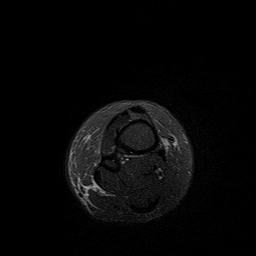

[Series 5: PD fat-sat · axial · 3.0mm · 0.62mm/px · z∈[-10,+132]mm · 9 of 38 slices shown]
[im 1/38]
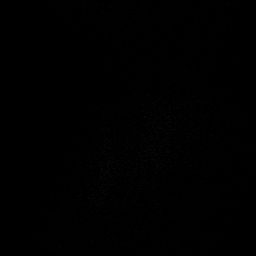
[im 5/38]
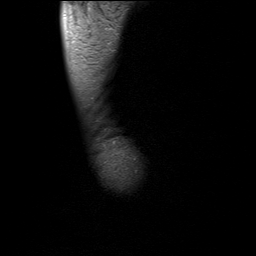
[im 10/38]
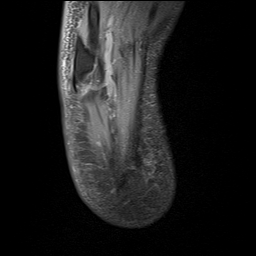
[im 14/38]
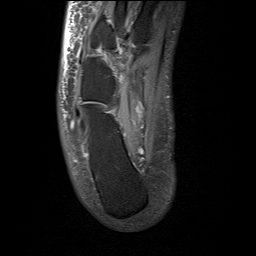
[im 19/38]
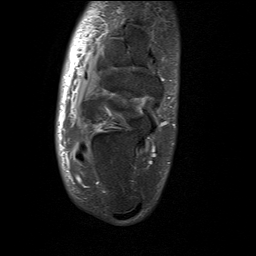
[im 24/38]
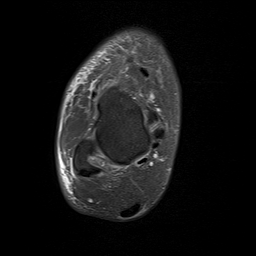
[im 28/38]
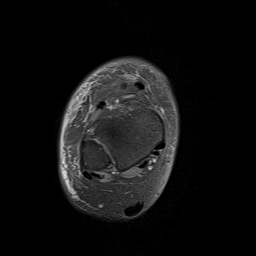
[im 33/38]
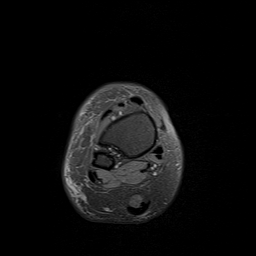
[im 38/38]
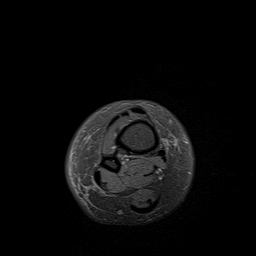

[Series 6: T1 · sagittal · 4.0mm · 0.70mm/px · 6 of 27 slices shown]
[im 1/27]
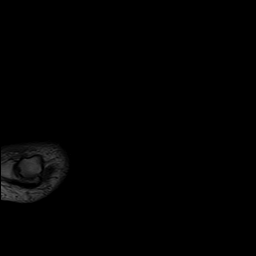
[im 6/27]
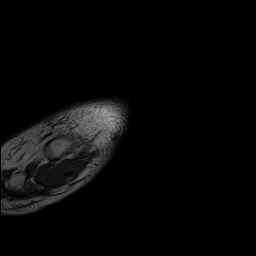
[im 11/27]
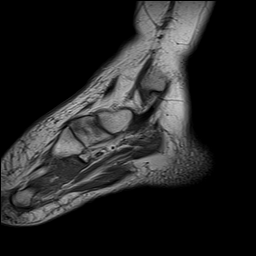
[im 16/27]
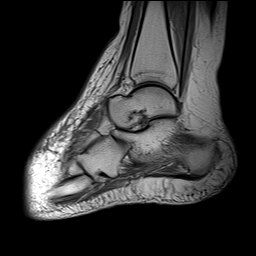
[im 21/27]
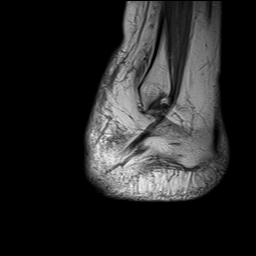
[im 27/27]
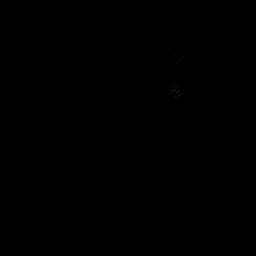

[Series 7: STIR · sagittal · 4.0mm · 0.39mm/px · 6 of 27 slices shown]
[im 1/27]
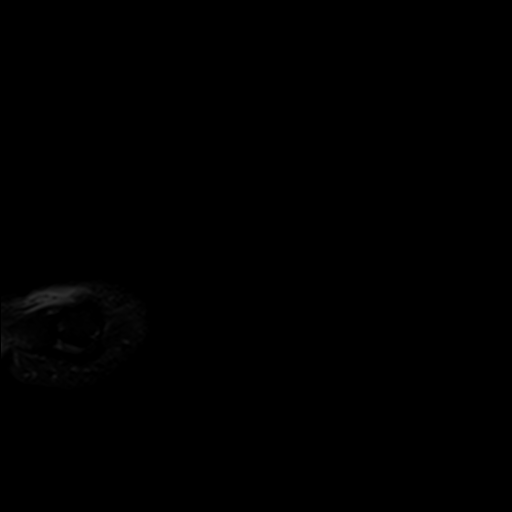
[im 6/27]
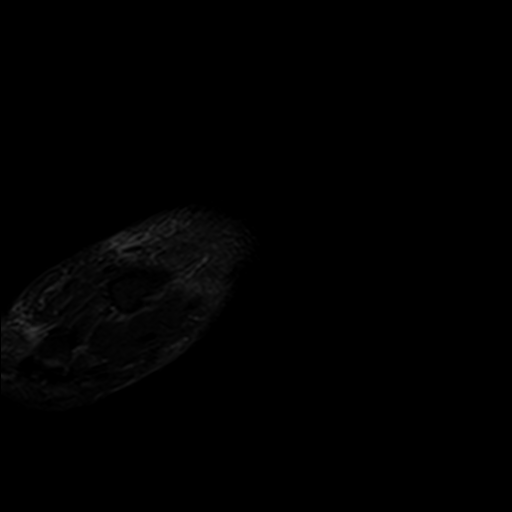
[im 11/27]
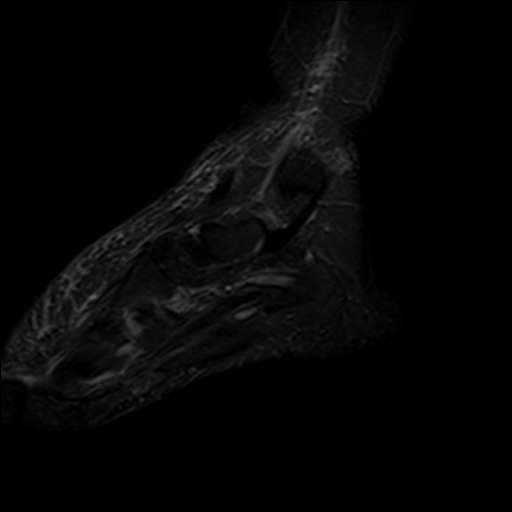
[im 16/27]
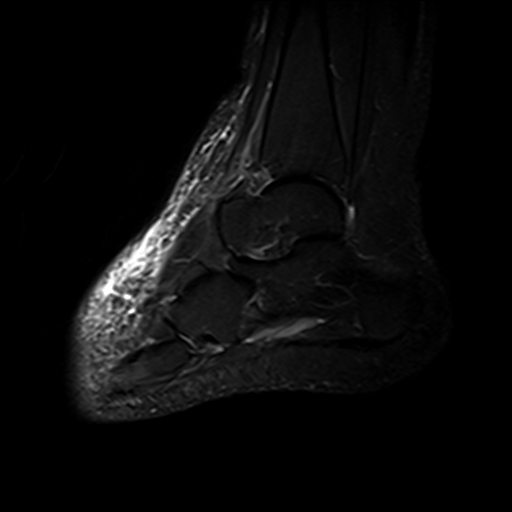
[im 21/27]
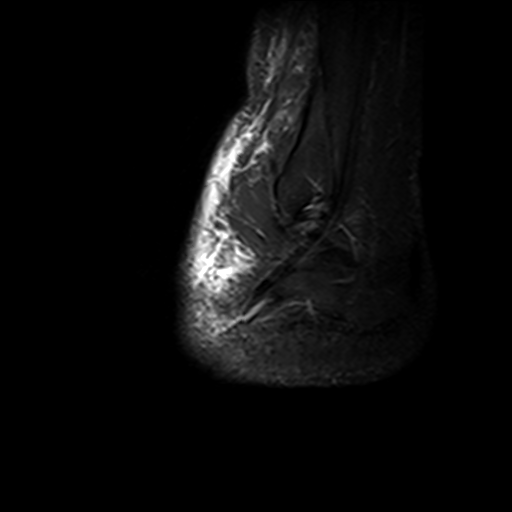
[im 27/27]
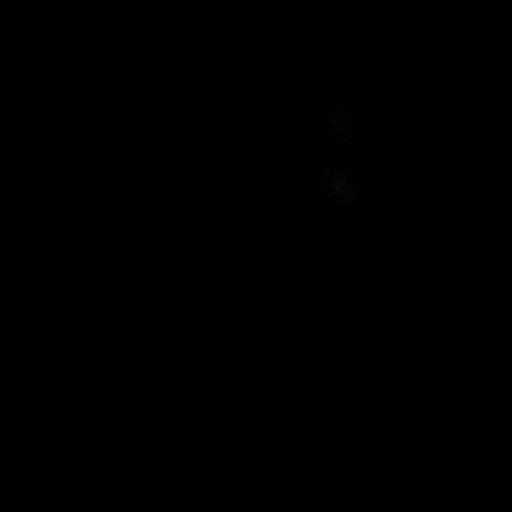

[Series 8: T2 fat-sat · coronal · 3.0mm · 0.62mm/px · 9 of 40 slices shown (2 of 2)]
[im 1/40]
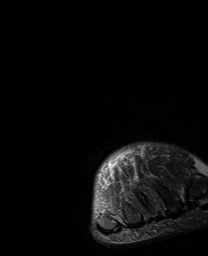
[im 5/40]
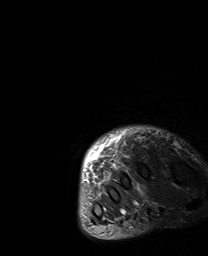
[im 10/40]
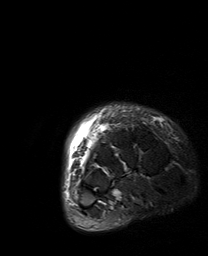
[im 15/40]
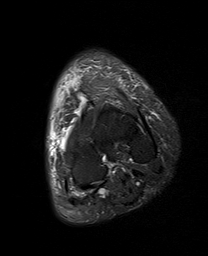
[im 20/40]
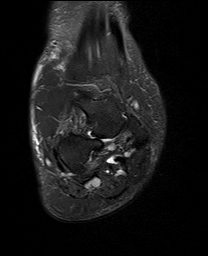
[im 25/40]
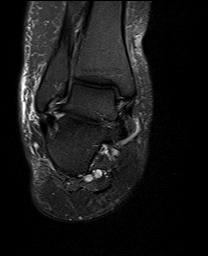
[im 30/40]
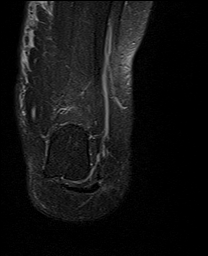
[im 35/40]
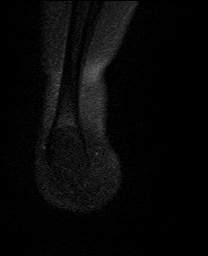
[im 40/40]
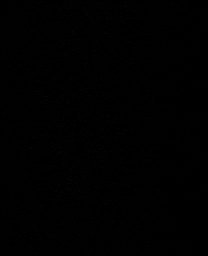

[40 of 40 positions shown; findings below may reference images not displayed]

FINDINGS: TENDONS

Peroneal: The peroneus longus and brevis tendons are intact.

Posteromedial: Intact tibialis posterior, flexor digitorum longus,
and flexor hallucis longus tendons.

Anterior: The tibialis anterior, extensor hallucis longus and
extensor digitorum longus tendons are intact.

Achilles: Intact.

Plantar Fascia: Small calcaneal heel spur. Very mild intermediate T2
signal and thickening at the origin of the medial band of the
plantar fascia (coronal series 8, image 9) with trace underlying
calcaneal marrow edema. No plantar fascia fluid bright tear or
retraction.

LIGAMENTS

Lateral: The anterior and posterior talofibular, anterior and
posterior tibiofibular, and calcaneofibular ligaments are intact.

Medial: Mild intermediate T2 signal within the mid AP dimension of
the tibiotalar deep deltoid ligament is similar to contralateral
side and may represent patient's normal anatomy. The tibial spring
ligament appears intact.

CARTILAGE

Ankle Joint: Intact cartilage.

Subtalar Joints/Sinus Tarsi: Fat is preserved within sinus tarsi.

Bones: No acute fracture.

Other: The tarsal tunnel is intact. The Lisfranc ligament complex is
intact.

Mild-to-moderate edema and swelling within the dorsal midfoot
subcutaneous fat.
IMPRESSION: :
IMPRESSION: 1. Small calcaneal heel spur with minimal medial band of the plantar
fasciitis. No fluid bright tear or retraction.
2. Mild-to-moderate edema and swelling within the dorsal midfoot
subcutaneous fat.
